# Patient Record
Sex: Male | Born: 1980 | Race: White | Hispanic: No | Marital: Married | Smoking: Never smoker
Health system: Southern US, Community
[De-identification: ages and names within clinical notes are randomized; demographics above are authoritative.]

## PROBLEM LIST (undated history)

## (undated) DIAGNOSIS — T7840XA Allergy, unspecified, initial encounter: Secondary | ICD-10-CM

## (undated) DIAGNOSIS — M1990 Unspecified osteoarthritis, unspecified site: Secondary | ICD-10-CM

## (undated) HISTORY — DX: Unspecified osteoarthritis, unspecified site: M19.90

## (undated) HISTORY — DX: Allergy, unspecified, initial encounter: T78.40XA

---

## 2003-03-08 ENCOUNTER — Emergency Department (HOSPITAL_COMMUNITY): Admission: EM | Admit: 2003-03-08 | Discharge: 2003-03-08 | Payer: Self-pay | Admitting: Family Medicine

## 2003-03-19 ENCOUNTER — Emergency Department (HOSPITAL_COMMUNITY): Admission: EM | Admit: 2003-03-19 | Discharge: 2003-03-19 | Payer: Self-pay | Admitting: Family Medicine

## 2006-03-06 ENCOUNTER — Emergency Department (HOSPITAL_COMMUNITY): Admission: EM | Admit: 2006-03-06 | Discharge: 2006-03-06 | Payer: Self-pay | Admitting: Family Medicine

## 2014-03-04 ENCOUNTER — Encounter: Payer: Self-pay | Admitting: Family Medicine

## 2014-03-24 ENCOUNTER — Ambulatory Visit (INDEPENDENT_AMBULATORY_CARE_PROVIDER_SITE_OTHER): Payer: BLUE CROSS/BLUE SHIELD | Admitting: Family Medicine

## 2014-03-24 ENCOUNTER — Encounter: Payer: Self-pay | Admitting: Family Medicine

## 2014-03-24 VITALS — BP 118/70 | HR 94 | Temp 98.3°F | Resp 18 | Ht 71.0 in | Wt 193.0 lb

## 2014-03-24 DIAGNOSIS — E291 Testicular hypofunction: Secondary | ICD-10-CM

## 2014-03-24 DIAGNOSIS — Z0000 Encounter for general adult medical examination without abnormal findings: Secondary | ICD-10-CM

## 2014-03-24 DIAGNOSIS — Z92241 Personal history of systemic steroid therapy: Secondary | ICD-10-CM

## 2014-03-24 LAB — CBC WITH DIFFERENTIAL/PLATELET
Basophils Absolute: 0 K/uL (ref 0.0–0.1)
Basophils Relative: 0 % (ref 0–1)
Eosinophils Absolute: 0.2 K/uL (ref 0.0–0.7)
Eosinophils Relative: 3 % (ref 0–5)
HCT: 45.2 % (ref 39.0–52.0)
Hemoglobin: 15.1 g/dL (ref 13.0–17.0)
Lymphocytes Relative: 27 % (ref 12–46)
Lymphs Abs: 1.8 K/uL (ref 0.7–4.0)
MCH: 30 pg (ref 26.0–34.0)
MCHC: 33.4 g/dL (ref 30.0–36.0)
MCV: 89.9 fL (ref 78.0–100.0)
MPV: 10.6 fL (ref 8.6–12.4)
Monocytes Absolute: 0.5 K/uL (ref 0.1–1.0)
Monocytes Relative: 7 % (ref 3–12)
Neutro Abs: 4.3 K/uL (ref 1.7–7.7)
Neutrophils Relative %: 63 % (ref 43–77)
Platelets: 184 K/uL (ref 150–400)
RBC: 5.03 MIL/uL (ref 4.22–5.81)
RDW: 13.6 % (ref 11.5–15.5)
WBC: 6.8 K/uL (ref 4.0–10.5)

## 2014-03-24 LAB — COMPLETE METABOLIC PANEL WITH GFR
ALT: 34 U/L (ref 0–53)
AST: 40 U/L — AB (ref 0–37)
Albumin: 4.9 g/dL (ref 3.5–5.2)
Alkaline Phosphatase: 73 U/L (ref 39–117)
BUN: 21 mg/dL (ref 6–23)
CHLORIDE: 100 meq/L (ref 96–112)
CO2: 26 mEq/L (ref 19–32)
Calcium: 9.7 mg/dL (ref 8.4–10.5)
Creat: 1.25 mg/dL (ref 0.50–1.35)
GFR, Est African American: 88 mL/min
GFR, Est Non African American: 76 mL/min
Glucose, Bld: 83 mg/dL (ref 70–99)
Potassium: 4 mEq/L (ref 3.5–5.3)
Sodium: 135 mEq/L (ref 135–145)
Total Bilirubin: 0.4 mg/dL (ref 0.2–1.2)
Total Protein: 7.4 g/dL (ref 6.0–8.3)

## 2014-03-24 LAB — TSH: TSH: 1.226 u[IU]/mL (ref 0.350–4.500)

## 2014-03-24 NOTE — Progress Notes (Signed)
Subjective:    Patient ID: Joshua BenchJonathan T Lotts, male    DOB: 10/13/1980, 34 y.o.   MRN: [id number]  HPI Patient is here today to establish care. Patient admits that in the past he is a used and abused anabolic steroids. He reports now that he is being cleared from steroids for over 2 years. Unfortunately he has poor energy, erectile dysfunction, decreased libido, and fatigue. He denies any headaches. He does have a tender nodule in his right breast deep to the nipple. It feels like an enlarged duct. It is extremely tender to the touch. This is been there for approximately 1 year. He denies any nipple discharge, milky nipple discharge, or bloody nipple discharge. Otherwise his review of systems is negative Past Medical History  Diagnosis Date  . Allergy   . Arthritis    No past surgical history on file. Current Outpatient Prescriptions on File Prior to Visit  Medication Sig Dispense Refill  . Melatonin 5 MG CAPS Take 2 capsules by mouth at bedtime.     No current facility-administered medications on file prior to visit.   Allergies  Allergen Reactions  . Amoxicillin Hives   History   Social History  . Marital Status: Married    Spouse Name: N/A  . Number of Children: N/A  . Years of Education: N/A   Occupational History  . Not on file.   Social History Main Topics  . Smoking status: Never Smoker   . Smokeless tobacco: Current User    Types: Chew  . Alcohol Use: No  . Drug Use: No  . Sexual Activity: Yes   Other Topics Concern  . Not on file   Social History Narrative   Family History  Problem Relation Age of Onset  . Diabetes Father   . Heart disease Father   . Arthritis Maternal Grandmother   . Cancer Maternal Grandmother       Review of Systems  All other systems reviewed and are negative.      Objective:   Physical Exam  Constitutional: He is oriented to person, place, and time. He appears well-developed and well-nourished. No distress.  HENT:    Head: Normocephalic and atraumatic.  Right Ear: External ear normal.  Left Ear: External ear normal.  Nose: Nose normal.  Mouth/Throat: Oropharynx is clear and moist. No oropharyngeal exudate.  Eyes: Conjunctivae and EOM are normal. Pupils are equal, round, and reactive to light. Right eye exhibits no discharge. Left eye exhibits no discharge. No scleral icterus.  Neck: Normal range of motion. Neck supple. No JVD present. No tracheal deviation present. No thyromegaly present.  Cardiovascular: Normal rate, regular rhythm, normal heart sounds and intact distal pulses.  Exam reveals no gallop and no friction rub.   No murmur heard. Pulmonary/Chest: Effort normal and breath sounds normal. No stridor. No respiratory distress. He has no wheezes. He has no rales. He exhibits no tenderness.  Abdominal: Soft. Bowel sounds are normal. He exhibits no distension and no mass. There is no tenderness. There is no rebound and no guarding.  Genitourinary: Penis normal.  Musculoskeletal: Normal range of motion. He exhibits no edema or tenderness.  Lymphadenopathy:    He has no cervical adenopathy.  Neurological: He is alert and oriented to person, place, and time. He has normal reflexes. He displays normal reflexes. No cranial nerve deficit. He exhibits normal muscle tone. Coordination normal.  Skin: He is not diaphoretic.  Vitals reviewed. Both testicles are descended and are normal without nodularity  or atrophy. There is a 1.5 cm tender nodular mass in his right nipple that appears to be an enlarged duct        Assessment & Plan:  Hypogonadism, testicular - Plan: COMPLETE METABOLIC PANEL WITH GFR, CBC with Differential/Platelet, TSH, Prolactin, Follicle stimulating hormone, Luteinizing hormone, Testosterone, free, total  Routine general medical examination at a health care facility - Plan: COMPLETE METABOLIC PANEL WITH GFR, CBC with Differential/Platelet, TSH, Prolactin, Follicle stimulating hormone,  Luteinizing hormone, Testosterone, free, total  History anabolic steroid use - Plan: COMPLETE METABOLIC PANEL WITH GFR, CBC with Differential/Platelet, TSH, Prolactin, Follicle stimulating hormone, Luteinizing hormone, Testosterone, free, total  Physical exam is relatively normal. I will check a CBC, CMP for risk stratification. I will also check a free testosterone level, prolactin level, and FSH, and LH level to evaluate for hypogonadism pituitary insufficiency secondary to his history of anabolic steroid use. If lab work is normal, I will proceed with an ultrasound of the breast to evaluate the mass in his right nipple further however I believe it is a benign enlarged duct.

## 2014-03-25 LAB — TESTOSTERONE, FREE, TOTAL, SHBG
SEX HORMONE BINDING: 100 nmol/L — AB (ref 10–50)
TESTOSTERONE FREE: 54.3 pg/mL (ref 47.0–244.0)
Testosterone-% Free: 0.9 % — ABNORMAL LOW (ref 1.6–2.9)
Testosterone: 584.43 ng/dL (ref 300–890)

## 2014-03-25 LAB — PROLACTIN: Prolactin: 10.4 ng/mL (ref 2.1–17.1)

## 2014-03-25 LAB — LUTEINIZING HORMONE: LH: 7.8 m[IU]/mL (ref 1.5–9.3)

## 2014-03-25 LAB — FOLLICLE STIMULATING HORMONE: FSH: 7.9 m[IU]/mL (ref 1.4–18.1)

## 2014-03-26 ENCOUNTER — Other Ambulatory Visit: Payer: Self-pay | Admitting: *Deleted

## 2014-03-26 ENCOUNTER — Telehealth: Payer: Self-pay | Admitting: Family Medicine

## 2014-03-26 DIAGNOSIS — N6310 Unspecified lump in the right breast, unspecified quadrant: Secondary | ICD-10-CM

## 2014-03-26 MED ORDER — SILDENAFIL CITRATE 100 MG PO TABS: 50.0000 mg | ORAL_TABLET | Freq: Every day | ORAL | Status: DC | PRN

## 2014-03-26 NOTE — Telephone Encounter (Signed)
Patient call and would like a call back regarding going over his lab results with him  [phone]

## 2014-03-26 NOTE — Telephone Encounter (Signed)
Call placed to patient and patient made aware.   Patient states that he spoke with insurance and was advised that they will not cover ED medication.   Requested to have Viagra called in to pharmacy.   Prescription sent to pharmacy.

## 2014-03-26 NOTE — Telephone Encounter (Signed)
Testosterone is not low.  It is not contributing to ED.  I would try viagra 50-100 mg poqday prn (6) rf =11

## 2014-03-26 NOTE — Telephone Encounter (Signed)
Call returned to patient.   States that he is concerned that there was no estrogen level drawn. He is also concerned that mass may grow over time if it is mammary related. Advised that surgical consult will be placed.   Also states that he is concerned that some of the testosterone levels were low, and feels that this may be contributing to ED. States that he discussed prescription for ED with MD.   MD please advise.

## 2014-03-26 NOTE — Telephone Encounter (Signed)
Returned call to patient.   States that he would like to try Cialis instead.   Also concerned about why sex binding hormone is elevated.   MD please advise.

## 2014-03-26 NOTE — Telephone Encounter (Signed)
Sex binding globulin is a naturally occuring protein that decreases the amount of free testosterone floating in the blood that is biologically active.  While the %free is low, the actual free testosterone level is normal and not playing a role in his problems.  I would suggest cialis 10-20 mg poqday prn

## 2014-04-01 ENCOUNTER — Telehealth: Payer: Self-pay | Admitting: Family Medicine

## 2014-04-01 NOTE — Telephone Encounter (Signed)
Complaining of a lot of situational stress.  Is wanting you to order him something he can take everyday to help with this.  Does not want Xanax or similar that will make him sleepy.  Was on Zoloft many years ago??  Wants your recommendation.  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  Just seen for CPE but if need to see again will schedule appt.

## 2014-04-02 MED ORDER — VENLAFAXINE HCL ER 75 MG PO CP24: 75.0000 mg | ORAL_CAPSULE | Freq: Every day | ORAL | Status: DC

## 2014-04-02 NOTE — Telephone Encounter (Signed)
Pt called and made aware of RX.  3 wk appt made

## 2014-04-02 NOTE — Telephone Encounter (Signed)
Lets try effexor xr 75 mg poqam and recheck here in 3 weeks to see if we need to increase to 150 mg.

## 2014-04-21 ENCOUNTER — Other Ambulatory Visit (HOSPITAL_COMMUNITY): Payer: Self-pay | Admitting: Orthopedic Surgery

## 2014-04-21 DIAGNOSIS — N6310 Unspecified lump in the right breast, unspecified quadrant: Secondary | ICD-10-CM

## 2014-04-24 ENCOUNTER — Encounter: Payer: Self-pay | Admitting: Family Medicine

## 2014-04-24 ENCOUNTER — Ambulatory Visit (INDEPENDENT_AMBULATORY_CARE_PROVIDER_SITE_OTHER): Payer: BLUE CROSS/BLUE SHIELD | Admitting: Family Medicine

## 2014-04-24 VITALS — BP 102/78 | HR 80 | Temp 97.8°F | Resp 18 | Wt 190.0 lb

## 2014-04-24 DIAGNOSIS — F411 Generalized anxiety disorder: Secondary | ICD-10-CM

## 2014-04-24 MED ORDER — BUPROPION HCL ER (XL) 150 MG PO TB24: 150.0000 mg | ORAL_TABLET | Freq: Every day | ORAL | Status: DC

## 2014-04-24 NOTE — Progress Notes (Signed)
   Subjective:    Patient ID: Joshua Dawson, male    DOB: 07/25/1980, 34 y.o.   MRN: [id number]  HPI Please see last phone note.  Was started on Effexor 75 mg XR poqam.  Patient states it is causing him to feel nervous and makes him feel too shaky.  He reports daily generalized anxiety. He denies panic attacks. He does report excessive worry over minor problems. Even after reassuring the patient that his labs are normal the patient still is worried he has a problem. He admits that this is become pathologic it prevents him from enjoying life. He reports insomnia. He has trouble concentrating due to fear and anxiety. He would like to try different option other than Effexor Past Medical History  Diagnosis Date  . Allergy   . Arthritis    No past surgical history on file. Current Outpatient Prescriptions on File Prior to Visit  Medication Sig Dispense Refill  . Melatonin 5 MG CAPS Take 2 capsules by mouth at bedtime.    . sildenafil (VIAGRA) 100 MG tablet Take 0.5-1 tablets (50-100 mg total) by mouth daily as needed for erectile dysfunction. 6 tablet 11  . venlafaxine XR (EFFEXOR-XR) 75 MG 24 hr capsule Take 1 capsule (75 mg total) by mouth daily with breakfast. 30 capsule 0   No current facility-administered medications on file prior to visit.   Allergies  Allergen Reactions  . Amoxicillin Hives   History   Social History  . Marital Status: Married    Spouse Name: N/A  . Number of Children: N/A  . Years of Education: N/A   Occupational History  . Not on file.   Social History Main Topics  . Smoking status: Never Smoker   . Smokeless tobacco: Current User    Types: Chew  . Alcohol Use: No  . Drug Use: No  . Sexual Activity: Yes   Other Topics Concern  . Not on file   Social History Narrative      Review of Systems  All other systems reviewed and are negative.      Objective:   Physical Exam  Cardiovascular: Normal rate, regular rhythm and normal heart sounds.    Pulmonary/Chest: Effort normal and breath sounds normal.  Psychiatric: He has a normal mood and affect. His behavior is normal. Judgment and thought content normal.  Vitals reviewed.         Assessment & Plan:  GAD (generalized anxiety disorder) - Plan: buPROPion (WELLBUTRIN XL) 150 MG 24 hr tablet  Begin Wellbutrin XL 150 mg by mouth every morning. Recheck in one month.  DC Effexor

## 2014-04-28 ENCOUNTER — Ambulatory Visit (HOSPITAL_COMMUNITY)
Admission: RE | Admit: 2014-04-28 | Discharge: 2014-04-28 | Disposition: A | Discharge status: Home / Self Care | Payer: BLUE CROSS/BLUE SHIELD | Source: Ambulatory Visit | Attending: Orthopedic Surgery | Admitting: Orthopedic Surgery

## 2014-04-28 ENCOUNTER — Other Ambulatory Visit (HOSPITAL_COMMUNITY): Payer: Self-pay | Admitting: Orthopedic Surgery

## 2014-04-28 DIAGNOSIS — N63 Unspecified lump in breast: Secondary | ICD-10-CM | POA: Insufficient documentation

## 2014-04-28 DIAGNOSIS — N6310 Unspecified lump in the right breast, unspecified quadrant: Secondary | ICD-10-CM

## 2014-05-06 ENCOUNTER — Other Ambulatory Visit: Payer: Self-pay | Admitting: Internal Medicine

## 2014-12-04 ENCOUNTER — Ambulatory Visit (INDEPENDENT_AMBULATORY_CARE_PROVIDER_SITE_OTHER): Payer: BLUE CROSS/BLUE SHIELD | Admitting: Family Medicine

## 2014-12-04 ENCOUNTER — Encounter: Payer: Self-pay | Admitting: Family Medicine

## 2014-12-04 ENCOUNTER — Telehealth: Payer: Self-pay | Admitting: Family Medicine

## 2014-12-04 VITALS — BP 104/70 | HR 84 | Temp 98.6°F | Resp 18 | Wt 188.0 lb

## 2014-12-04 DIAGNOSIS — E291 Testicular hypofunction: Secondary | ICD-10-CM

## 2014-12-04 NOTE — Telephone Encounter (Signed)
Patient would like to start using [facility] his ID is [id number] the Rx Grp is [id number]. I have scanned a copy of the prescription card as well.

## 2014-12-04 NOTE — Progress Notes (Signed)
Subjective:    Patient ID: Joshua Dawson, male    DOB: 24-Mar-1980, 34 y.o.   MRN: [id number]  HPI  Please see previous office visits. When I enter the exam room today, the patient changed the chief complaint from numbness in his left fifth toe to his persistent erectile dysfunction. When he takes Viagra, he has no problems with erectile dysfunction. However he is still concerned that there is a hormone imbalance in his body. I have tried to assuage his fears several times in the past but he continues to fixate on this concern. He is requesting that he have an estrogen level checked today. He is also concerned because his sex hormone binding globulin level has been high in the past even though his percent free testosterone and free testosterone levels were normal. Past Medical History  Diagnosis Date  . Allergy   . Arthritis    No past surgical history on file. Current Outpatient Prescriptions on File Prior to Visit  Medication Sig Dispense Refill  . Melatonin 5 MG CAPS Take 2 capsules by mouth at bedtime.    Marland Kitchen buPROPion (WELLBUTRIN XL) 150 MG 24 hr tablet Take 1 tablet (150 mg total) by mouth daily. (Patient not taking: Reported on 12/04/2014) 30 tablet 5  . sildenafil (VIAGRA) 100 MG tablet Take 0.5-1 tablets (50-100 mg total) by mouth daily as needed for erectile dysfunction. 6 tablet 11  . venlafaxine XR (EFFEXOR-XR) 75 MG 24 hr capsule Take 1 capsule (75 mg total) by mouth daily with breakfast. (Patient not taking: Reported on 12/04/2014) 30 capsule 0   No current facility-administered medications on file prior to visit.   Allergies  Allergen Reactions  . Amoxicillin Hives   Social History   Social History  . Marital Status: Married    Spouse Name: N/A  . Number of Children: N/A  . Years of Education: N/A   Occupational History  . Not on file.   Social History Main Topics  . Smoking status: Never Smoker   . Smokeless tobacco: Current User    Types: Chew  . Alcohol  Use: No  . Drug Use: No  . Sexual Activity: Yes   Other Topics Concern  . Not on file   Social History Narrative   Family History  Problem Relation Age of Onset  . Diabetes Father   . Heart disease Father   . Arthritis Maternal Grandmother   . Cancer Maternal Grandmother       Review of Systems  All other systems reviewed and are negative.      Objective:   Physical Exam  Constitutional: He is oriented to person, place, and time. He appears well-developed and well-nourished. No distress.  Eyes: Conjunctivae and EOM are normal. Pupils are equal, round, and reactive to light. Right eye exhibits no discharge. Left eye exhibits no discharge. No scleral icterus.  Cardiovascular: Normal rate, regular rhythm, normal heart sounds and intact distal pulses.  Exam reveals no gallop and no friction rub.   No murmur heard. Pulmonary/Chest: Effort normal and breath sounds normal. No respiratory distress. He has no wheezes. He has no rales. He exhibits no tenderness.  Abdominal: Soft. Bowel sounds are normal. He exhibits no distension and no mass. There is no tenderness. There is no rebound and no guarding.  Neurological: He is alert and oriented to person, place, and time. He has normal reflexes. No cranial nerve deficit. He exhibits normal muscle tone. Coordination normal.  Skin: He is not diaphoretic.  Vitals reviewed.       Assessment & Plan:  Hypogonadism in male - Plan: Testosterone, Free, Total, SHBG, Estrogens, Total  I tried to reassure the patient that he wants his hormone levels checked again for peace of mind. I will gladly recheck his hormone levels. However I explained to the patient I believe his anxiety is causing him to fixate on this. If his hormone levels are normal, I will continue to treat the patient's erectile dysfunction with Viagra 50-100 mg by mouth daily when necessary.

## 2014-12-07 LAB — TESTOSTERONE, FREE, TOTAL, SHBG
SEX HORMONE BINDING: 103 nmol/L — AB (ref 10–50)
TESTOSTERONE-% FREE: 1 % — AB (ref 1.6–2.9)
TESTOSTERONE: 817 ng/dL (ref 300–890)
Testosterone, Free: 78.1 pg/mL (ref 47.0–244.0)

## 2014-12-08 LAB — ESTROGENS, TOTAL: ESTROGEN: 188.7 pg/mL (ref 60–190)

## 2014-12-10 ENCOUNTER — Telehealth: Payer: Self-pay | Admitting: *Deleted

## 2014-12-10 NOTE — Telephone Encounter (Signed)
Trintellix to replace effexor.  Begin 5 mg poqday and increase to 10 mg poqday in 1 week.  I would have him come by for samples and also a copay card.

## 2014-12-10 NOTE — Telephone Encounter (Signed)
Call placed to patient to discuss lab results.   States that during OV, MD recommended a new anti-anxiety medication.   MD please advise.

## 2014-12-10 NOTE — Telephone Encounter (Signed)
Call placed to patient and patient made aware.   Samples for Trintellix 5mg  left up front.   No samples available for 10mg .

## 2014-12-17 NOTE — Telephone Encounter (Signed)
 10mg  samples left up front for patient.

## 2015-08-05 ENCOUNTER — Ambulatory Visit (INDEPENDENT_AMBULATORY_CARE_PROVIDER_SITE_OTHER): Payer: BLUE CROSS/BLUE SHIELD | Admitting: Family Medicine

## 2015-08-05 ENCOUNTER — Encounter: Payer: Self-pay | Admitting: Family Medicine

## 2015-08-05 VITALS — BP 132/72 | HR 88 | Temp 98.4°F | Resp 16 | Ht 71.0 in | Wt 187.0 lb

## 2015-08-05 DIAGNOSIS — N5089 Other specified disorders of the male genital organs: Secondary | ICD-10-CM

## 2015-08-05 DIAGNOSIS — N509 Disorder of male genital organs, unspecified: Secondary | ICD-10-CM

## 2015-08-05 NOTE — Progress Notes (Signed)
   Subjective:    Patient ID: Joshua Dawson, male    DOB: 02/01/1980, 35 y.o.   MRN: [id number]  HPI Patient has been sawing wood recently on his property. He developed pain in his lower back. He also developed pain in the left inguinal canal. In palpating that area he found a small BB sized mass on his left testicle near the epididymis. He also reports pain in the left inguinal canal. On examination today, I can appreciate no testicular mass. I believe the patient was feeling the appendix of the testes near the epididymis. There is no evidence of testicular cancer, varicocele, cystocele, or lymphadenopathy. On examination of the left inguinal canal however there may be a small inguinal hernia that I can appreciate on the tip of my finger with strenuous Valsalva. Past Medical History  Diagnosis Date  . Allergy   . Arthritis    No past surgical history on file. No current outpatient prescriptions on file prior to visit.   No current facility-administered medications on file prior to visit.   Allergies  Allergen Reactions  . Amoxicillin Hives   Social History   Social History  . Marital Status: Married    Spouse Name: N/A  . Number of Children: N/A  . Years of Education: N/A   Occupational History  . Not on file.   Social History Main Topics  . Smoking status: Never Smoker   . Smokeless tobacco: Current User    Types: Chew  . Alcohol Use: No  . Drug Use: No  . Sexual Activity: Yes   Other Topics Concern  . Not on file   Social History Narrative      Review of Systems  All other systems reviewed and are negative.      Objective:   Physical Exam  Cardiovascular: Normal rate, regular rhythm and normal heart sounds.   Pulmonary/Chest: Effort normal and breath sounds normal. No respiratory distress. He has no wheezes. He has no rales.  Abdominal: Soft. Bowel sounds are normal. A hernia is present. Hernia confirmed positive in the left inguinal area.    Genitourinary: Testes normal and penis normal. Right testis shows no mass, no swelling and no tenderness. Right testis is descended. Cremasteric reflex is not absent on the right side. Left testis shows no mass, no swelling and no tenderness. Left testis is descended. Cremasteric reflex is not absent on the left side.  Musculoskeletal: He exhibits no tenderness.  Lymphadenopathy:       Left: No inguinal adenopathy present.  Vitals reviewed.         Assessment & Plan:  Testicular mass  I believe the small BB sized mass on his left testicle is actually a benign growth call the appendix of the testicle. I can appreciate no testicular mass today on exam. There is no lymphadenopathy. There is no varicocele or cystocele. He may have a very small left inguinal hernia. At the present time we will monitor this area clinically and should he develop swelling in the left inguinal canal, I will consult general surgery. I believe the pain in his lower back was likely a muscle strain due to lifting heavy weight while cutting firewood

## 2016-06-12 ENCOUNTER — Ambulatory Visit (INDEPENDENT_AMBULATORY_CARE_PROVIDER_SITE_OTHER): Payer: BLUE CROSS/BLUE SHIELD | Admitting: Family Medicine

## 2016-06-12 ENCOUNTER — Encounter: Payer: Self-pay | Admitting: Family Medicine

## 2016-06-12 VITALS — BP 110/80 | HR 82 | Temp 97.8°F | Resp 16 | Ht 71.0 in | Wt 187.0 lb

## 2016-06-12 DIAGNOSIS — G43109 Migraine with aura, not intractable, without status migrainosus: Secondary | ICD-10-CM

## 2016-06-12 NOTE — Progress Notes (Signed)
     Subjective:    Patient ID: Joshua BenchJonathan T Njie, male    DOB: 03/14/1980, 36 y.o.   MRN: [id number]  HPI Patient Reports that 5 times in the last year, he has had a vertical zigzag line form in his vision followed by a pulsatile headache that last approximately one hour. There is no photophobia but there is photophobia. There is also nausea. Headache abruptly subsides if he takes ibuprofen. He denies any head injury. He denies any seizure activity. He denies any double vision. He denies any neurologic deficit. There is no family history of migraines. His vision is perfect otherwise. He gets his eyes checked regularly at work and he has no visual deficits. Past Medical History:  Diagnosis Date  . Allergy   . Arthritis    No past surgical history on file. No current outpatient prescriptions on file prior to visit.   No current facility-administered medications on file prior to visit.    Allergies  Allergen Reactions  . Amoxicillin Hives   Social History   Social History  . Marital status: Married    Spouse name: N/A  . Number of children: N/A  . Years of education: N/A   Occupational History  . Not on file.   Social History Main Topics  . Smoking status: Never Smoker  . Smokeless tobacco: Current User    Types: Chew  . Alcohol use No  . Drug use: No  . Sexual activity: Yes   Other Topics Concern  . Not on file   Social History Narrative  . No narrative on file      Review of Systems  All other systems reviewed and are negative.      Objective:   Physical Exam  Constitutional: He is oriented to person, place, and time.  Eyes:  Fundoscopic exam:      The right eye shows no hemorrhage and no papilledema.       The left eye shows no hemorrhage and no papilledema.  Cardiovascular: Normal rate, regular rhythm and normal heart sounds.   Pulmonary/Chest: Effort normal and breath sounds normal. No respiratory distress. He has no wheezes. He has no rales.    Abdominal: Soft. Bowel sounds are normal.  Musculoskeletal: He exhibits no tenderness.  Neurological: He is alert and oriented to person, place, and time. He has normal strength and normal reflexes. No cranial nerve deficit or sensory deficit. He displays a negative Romberg sign.  Vitals reviewed.         Assessment & Plan:  Migraine with aura and without status migrainosus, not intractable  History is consistent for migraine with aura. At the present time they have only occurred 5 times the last year. They also easily resolved with ibuprofen. Therefore I recommended ibuprofen as needed for the headaches and reassurance. If symptoms change, recheck immediately

## 2016-08-22 ENCOUNTER — Ambulatory Visit (INDEPENDENT_AMBULATORY_CARE_PROVIDER_SITE_OTHER): Payer: BLUE CROSS/BLUE SHIELD | Admitting: Urology

## 2016-08-22 DIAGNOSIS — N522 Drug-induced erectile dysfunction: Secondary | ICD-10-CM

## 2016-08-22 DIAGNOSIS — R102 Pelvic and perineal pain: Secondary | ICD-10-CM | POA: Diagnosis not present

## 2016-09-19 ENCOUNTER — Ambulatory Visit: Payer: BLUE CROSS/BLUE SHIELD | Admitting: General Surgery

## 2016-10-05 ENCOUNTER — Ambulatory Visit (INDEPENDENT_AMBULATORY_CARE_PROVIDER_SITE_OTHER): Payer: BLUE CROSS/BLUE SHIELD | Admitting: General Surgery

## 2016-10-05 ENCOUNTER — Encounter: Payer: Self-pay | Admitting: General Surgery

## 2016-10-05 VITALS — BP 128/79 | HR 98 | Temp 98.6°F | Resp 20 | Ht 71.0 in | Wt 191.0 lb

## 2016-10-05 DIAGNOSIS — K4090 Unilateral inguinal hernia, without obstruction or gangrene, not specified as recurrent: Secondary | ICD-10-CM

## 2016-10-05 NOTE — Patient Instructions (Signed)

## 2016-10-05 NOTE — Progress Notes (Signed)
Joshua BenchJonathan T Pallas; [id number]; 11/23/1980   HPI   Patient is a 36 year old white male who was referred to my care by Dr Ronne BinningMckenzie for a left inguinal hernia.  He was referred to urology due to left testicular pain.  He states that this started recently while lifting weights.  He works out frequently.  He denies any nausea or vomiting.  He states the pain has decreased since he is likely due to his workout load.  He currently has no pain.   He does not notice a significant bulge.  The pain does radiate down to the left testicle. Past Medical History:  Diagnosis Date  . Allergy   . Arthritis     No past surgical history on file.  Family History  Problem Relation Age of Onset  . Diabetes Father   . Heart disease Father   . Arthritis Maternal Grandmother   . Cancer Maternal Grandmother     No current outpatient prescriptions on file prior to visit.   No current facility-administered medications on file prior to visit.     Allergies  Allergen Reactions  . Amoxicillin Hives    History  Alcohol Use No    History  Smoking Status  . Never Smoker  Smokeless Tobacco  . Current User  . Types: Chew    Review of Systems  Constitutional: Negative.   HENT: Negative.   Eyes: Negative.   Respiratory: Negative.   Cardiovascular: Negative.   Gastrointestinal: Negative.   Genitourinary: Negative.   Musculoskeletal: Negative.   Skin: Negative.   Neurological: Negative.   Endo/Heme/Allergies: Negative.   Psychiatric/Behavioral: Negative.     Objective   Vitals:   10/05/16 1205  BP: 128/79  Pulse: 98  Resp: 20  Temp: 98.6 F (37 C)    Physical Exam  Constitutional: He is oriented to person, place, and time and well-developed, well-nourished, and in no distress.  HENT:  Head: Normocephalic and atraumatic.  Cardiovascular: Normal rate, regular rhythm and normal heart sounds.  Exam reveals no friction rub.   No murmur heard. Pulmonary/Chest: Effort normal and breath  sounds normal. No respiratory distress. He has no wheezes. He has no rales.  Abdominal: Soft. Bowel sounds are normal. He exhibits no distension. There is no tenderness. There is no rebound.  Small easily reducible left inguinal hernia with weak floor.  Neurological: He is alert and oriented to person, place, and time.  Skin: Skin is warm and dry.  Vitals reviewed.  Dr. Dimas MillinMcKenzie's notes reviewed. Assessment    Left inguinal hernia, currently asymptomatic Plan    I did offer surgical repair, but the patient states he is trying to control his workouts so that it does not cause him a problem.  Should he continue having issues with future, I do recommend a left renal herniorrhaphy with mesh.  He understands this and agrees.  Follow up as needed.

## 2016-11-24 DIAGNOSIS — E291 Testicular hypofunction: Secondary | ICD-10-CM | POA: Diagnosis not present

## 2016-11-29 ENCOUNTER — Ambulatory Visit (INDEPENDENT_AMBULATORY_CARE_PROVIDER_SITE_OTHER): Payer: BLUE CROSS/BLUE SHIELD | Admitting: Urology

## 2016-11-29 DIAGNOSIS — E291 Testicular hypofunction: Secondary | ICD-10-CM | POA: Diagnosis not present

## 2016-11-29 DIAGNOSIS — N522 Drug-induced erectile dysfunction: Secondary | ICD-10-CM | POA: Diagnosis not present

## 2017-02-21 ENCOUNTER — Ambulatory Visit (INDEPENDENT_AMBULATORY_CARE_PROVIDER_SITE_OTHER): Payer: BLUE CROSS/BLUE SHIELD | Admitting: Urology

## 2017-02-21 DIAGNOSIS — R109 Unspecified abdominal pain: Secondary | ICD-10-CM

## 2017-05-23 ENCOUNTER — Ambulatory Visit (INDEPENDENT_AMBULATORY_CARE_PROVIDER_SITE_OTHER): Payer: BLUE CROSS/BLUE SHIELD | Admitting: Urology

## 2017-05-23 DIAGNOSIS — N522 Drug-induced erectile dysfunction: Secondary | ICD-10-CM

## 2017-05-23 DIAGNOSIS — E291 Testicular hypofunction: Secondary | ICD-10-CM | POA: Diagnosis not present

## 2017-05-23 DIAGNOSIS — R109 Unspecified abdominal pain: Secondary | ICD-10-CM | POA: Diagnosis not present

## 2017-08-23 ENCOUNTER — Ambulatory Visit: Payer: BLUE CROSS/BLUE SHIELD | Admitting: "Endocrinology

## 2017-10-11 ENCOUNTER — Ambulatory Visit: Payer: BLUE CROSS/BLUE SHIELD | Admitting: "Endocrinology

## 2017-11-21 ENCOUNTER — Ambulatory Visit: Payer: BLUE CROSS/BLUE SHIELD | Admitting: Urology

## 2017-11-26 ENCOUNTER — Ambulatory Visit: Payer: BLUE CROSS/BLUE SHIELD | Admitting: "Endocrinology

## 2017-12-26 ENCOUNTER — Ambulatory Visit (INDEPENDENT_AMBULATORY_CARE_PROVIDER_SITE_OTHER): Payer: BLUE CROSS/BLUE SHIELD | Admitting: Urology

## 2017-12-26 DIAGNOSIS — N522 Drug-induced erectile dysfunction: Secondary | ICD-10-CM

## 2017-12-26 DIAGNOSIS — R109 Unspecified abdominal pain: Secondary | ICD-10-CM

## 2017-12-26 DIAGNOSIS — E291 Testicular hypofunction: Secondary | ICD-10-CM | POA: Diagnosis not present

## 2018-04-01 DIAGNOSIS — E291 Testicular hypofunction: Secondary | ICD-10-CM | POA: Diagnosis not present

## 2018-04-03 ENCOUNTER — Ambulatory Visit: Payer: BLUE CROSS/BLUE SHIELD | Admitting: Urology

## 2018-10-02 ENCOUNTER — Ambulatory Visit: Payer: BLUE CROSS/BLUE SHIELD | Admitting: Urology

## 2018-11-27 ENCOUNTER — Ambulatory Visit (INDEPENDENT_AMBULATORY_CARE_PROVIDER_SITE_OTHER): Payer: BC Managed Care – PPO | Admitting: Urology

## 2018-11-27 DIAGNOSIS — E291 Testicular hypofunction: Secondary | ICD-10-CM

## 2018-11-27 DIAGNOSIS — N522 Drug-induced erectile dysfunction: Secondary | ICD-10-CM

## 2019-01-21 ENCOUNTER — Encounter: Payer: Self-pay | Admitting: Urology

## 2019-07-11 ENCOUNTER — Other Ambulatory Visit: Payer: Self-pay

## 2019-07-11 DIAGNOSIS — E291 Testicular hypofunction: Secondary | ICD-10-CM | POA: Diagnosis not present

## 2019-07-14 LAB — ESTRADIOL: Estradiol: 20 pg/mL (ref <=39)

## 2019-07-14 LAB — TESTOSTERONE TOTAL,FREE,BIO, MALES
Albumin: 4.9 g/dL (ref 3.6–5.1)
Sex Hormone Binding: 104 nmol/L — ABNORMAL HIGH (ref 10–50)
Testosterone, Bioavailable: 88.9 ng/dL — ABNORMAL LOW (ref >=110.0)
Testosterone, Free: 39.8 pg/mL — ABNORMAL LOW (ref 46.0–224.0)
Testosterone: 817 ng/dL (ref 250–827)

## 2019-07-14 LAB — CBC WITH DIFFERENTIAL/PLATELET
Absolute Monocytes: 504 cells/uL (ref 200–950)
Basophils Absolute: 44 cells/uL (ref 0–200)
Basophils Relative: 0.6 %
Eosinophils Absolute: 248 cells/uL (ref 15–500)
Eosinophils Relative: 3.4 %
HCT: 44 % (ref 38.5–50.0)
Hemoglobin: 14.9 g/dL (ref 13.2–17.1)
Lymphs Abs: 1467 cells/uL (ref 850–3900)
MCH: 30.2 pg (ref 27.0–33.0)
MCHC: 33.9 g/dL (ref 32.0–36.0)
MCV: 89.1 fL (ref 80.0–100.0)
MPV: 11.2 fL (ref 7.5–12.5)
Monocytes Relative: 6.9 %
Neutro Abs: 5037 cells/uL (ref 1500–7800)
Neutrophils Relative %: 69 %
Platelets: 185 10*3/uL (ref 140–400)
RBC: 4.94 10*6/uL (ref 4.20–5.80)
RDW: 12.6 % (ref 11.0–15.0)
Total Lymphocyte: 20.1 %
WBC: 7.3 10*3/uL (ref 3.8–10.8)

## 2019-07-14 LAB — COMPREHENSIVE METABOLIC PANEL
AG Ratio: 2.1 (calc) (ref 1.0–2.5)
ALT: 18 U/L (ref 9–46)
AST: 23 U/L (ref 10–40)
Albumin: 4.9 g/dL (ref 3.6–5.1)
Alkaline phosphatase (APISO): 67 U/L (ref 36–130)
BUN: 17 mg/dL (ref 7–25)
CO2: 29 mmol/L (ref 20–32)
Calcium: 9.8 mg/dL (ref 8.6–10.3)
Chloride: 103 mmol/L (ref 98–110)
Creat: 1.03 mg/dL (ref 0.60–1.35)
Globulin: 2.3 g/dL (calc) (ref 1.9–3.7)
Glucose, Bld: 98 mg/dL (ref 65–139)
Potassium: 4.3 mmol/L (ref 3.5–5.3)
Sodium: 138 mmol/L (ref 135–146)
Total Bilirubin: 0.5 mg/dL (ref 0.2–1.2)
Total Protein: 7.2 g/dL (ref 6.1–8.1)

## 2019-07-21 NOTE — Progress Notes (Signed)
Labs sent via my chart

## 2019-08-25 ENCOUNTER — Other Ambulatory Visit: Payer: Self-pay

## 2019-08-25 ENCOUNTER — Ambulatory Visit (INDEPENDENT_AMBULATORY_CARE_PROVIDER_SITE_OTHER): Payer: BC Managed Care – PPO | Admitting: Urology

## 2019-08-25 ENCOUNTER — Encounter: Payer: Self-pay | Admitting: Urology

## 2019-08-25 VITALS — BP 101/63 | HR 73 | Temp 97.9°F | Ht 72.0 in | Wt 190.0 lb

## 2019-08-25 DIAGNOSIS — N528 Other male erectile dysfunction: Secondary | ICD-10-CM | POA: Diagnosis not present

## 2019-08-25 DIAGNOSIS — E291 Testicular hypofunction: Secondary | ICD-10-CM | POA: Insufficient documentation

## 2019-08-25 MED ORDER — SILDENAFIL CITRATE 20 MG PO TABS: 20.0000 mg | ORAL_TABLET | ORAL | 3 refills | Status: DC | PRN

## 2019-08-25 NOTE — Patient Instructions (Signed)
Erectile Dysfunction Erectile dysfunction (ED) is the inability to get or keep an erection in order to have sexual intercourse. Erectile dysfunction may include:  Inability to get an erection.  Lack of enough hardness of the erection to allow penetration.  Loss of the erection before sex is finished. What are the causes? This condition may be caused by:  Certain medicines, such as: ? Pain relievers. ? Antihistamines. ? Antidepressants. ? Blood pressure medicines. ? Water pills (diuretics). ? Ulcer medicines. ? Muscle relaxants. ? Drugs.  Excessive drinking.  Psychological causes, such as: ? Anxiety. ? Depression. ? Sadness. ? Exhaustion. ? Performance fear. ? Stress.  Physical causes, such as: ? Artery problems. This may include diabetes, smoking, liver disease, or atherosclerosis. ? High blood pressure. ? Hormonal problems, such as low testosterone. ? Obesity. ? Nerve problems. This may include back or pelvic injuries, diabetes mellitus, multiple sclerosis, or Parkinson disease. What are the signs or symptoms? Symptoms of this condition include:  Inability to get an erection.  Lack of enough hardness of the erection to allow penetration.  Loss of the erection before sex is finished.  Normal erections at some times, but with frequent unsatisfactory episodes.  Low sexual satisfaction in either partner due to erection problems.  A curved penis occurring with erection. The curve may cause pain or the penis may be too curved to allow for intercourse.  Never having nighttime erections. How is this diagnosed? This condition is often diagnosed by:  Performing a physical exam to find other diseases or specific problems with the penis.  Asking you detailed questions about the problem.  Performing blood tests to check for diabetes mellitus or to measure hormone levels.  Performing other tests to check for underlying health conditions.  Performing an ultrasound  exam to check for scarring.  Performing a test to check blood flow to the penis.  Doing a sleep study at home to measure nighttime erections. How is this treated? This condition may be treated by:  Medicine taken by mouth to help you achieve an erection (oral medicine).  Hormone replacement therapy to replace low testosterone levels.  Medicine that is injected into the penis. Your health care provider may instruct you how to give yourself these injections at home.  Vacuum pump. This is a pump with a ring on it. The pump and ring are placed on the penis and used to create pressure that helps the penis become erect.  Penile implant surgery. In this procedure, you may receive: ? An inflatable implant. This consists of cylinders, a pump, and a reservoir. The cylinders can be inflated with a fluid that helps to create an erection, and they can be deflated after intercourse. ? A semi-rigid implant. This consists of two silicone rubber rods. The rods provide some rigidity. They are also flexible, so the penis can both curve downward in its normal position and become straight for sexual intercourse.  Blood vessel surgery, to improve blood flow to the penis. During this procedure, a blood vessel from a different part of the body is placed into the penis to allow blood to flow around (bypass) damaged or blocked blood vessels.  Lifestyle changes, such as exercising more, losing weight, and quitting smoking. Follow these instructions at home: Medicines   Take over-the-counter and prescription medicines only as told by your health care provider. Do not increase the dosage without first discussing it with your health care provider.  If you are using self-injections, perform injections as directed by your   health care provider. Make sure to avoid any veins that are on the surface of the penis. After giving an injection, apply pressure to the injection site for 5 minutes. General  instructions  Exercise regularly, as directed by your health care provider. Work with your health care provider to lose weight, if needed.  Do not use any products that contain nicotine or tobacco, such as cigarettes and e-cigarettes. If you need help quitting, ask your health care provider.  Before using a vacuum pump, read the instructions that come with the pump and discuss any questions with your health care provider.  Keep all follow-up visits as told by your health care provider. This is important. Contact a health care provider if:  You feel nauseous.  You vomit. Get help right away if:  You are taking oral or injectable medicines and you have an erection that lasts longer than 4 hours. If your health care provider is unavailable, go to the nearest emergency room for evaluation. An erection that lasts much longer than 4 hours can result in permanent damage to your penis.  You have severe pain in your groin or abdomen.  You develop redness or severe swelling of your penis.  You have redness spreading up into your groin or lower abdomen.  You are unable to urinate.  You experience chest pain or a rapid heart beat (palpitations) after taking oral medicines. Summary  Erectile dysfunction (ED) is the inability to get or keep an erection during sexual intercourse. This problem can usually be treated successfully.  This condition is diagnosed based on a physical exam, your symptoms, and tests to determine the cause. Treatment varies depending on the cause, and may include medicines, hormone therapy, surgery, or vacuum pump.  You may need follow-up visits to make sure that you are using your medicines or devices correctly.  Get help right away if you are taking or injecting medicines and you have an erection that lasts longer than 4 hours. This information is not intended to replace advice given to you by your health care provider. Make sure you discuss any questions you have with  your health care provider. Document Revised: 12/29/2016 Document Reviewed: 02/02/2016 Elsevier Patient Education  2020 Elsevier Inc.  

## 2019-08-25 NOTE — Progress Notes (Signed)

## 2019-08-25 NOTE — Progress Notes (Signed)
08/25/2019 2:56 PM   Joshua Dawson [birth year] [id number]  Referring provider: Donita Brooks, MD [address] [place],  [place] [address]  Erectile dysfunction  HPI: Joshua Dawson is a 38yo here for followup for hypogonadism and erectile dysfunction. Testosterone 817, CMP normal, hgb 14.9. Good energy. Since last visit he has increased to sildenafil to 40mg  with better results.   His records from [facility] are as follows: I am having trouble with my erections. HPI: Joshua Dawson is a 39 year-old male established patient who is here for erectile dysfunction.  He first stated noticing pain on approximately 07/30/2013. His symptoms did begin gradually. His symptoms have been worse over the last year.   He does not have difficulties achieving an erection. He does have problems maintaining his erections. His erections are straight.   He does not have premature ejaculation. He does not have trouble reaching climax. He does have anxiety because of the symptoms.   08/22/2016: He has worsening ED for the past 3 years. He has a hx of anabolic steroid use which he took for over 2 years. He quit it 3 years ago. He has decreased desire. He had his testosterone which was 800 with 1.0% free and SHBG of 100. He has tried Boron, Magnesium and fish oil to lower it without results   11/29/2016: He has good erections with 40mg  sildenafil   05/23/2017: He was using sildenafil with good results.   12/26/2017: He uses sildenafil prn with good results   11/27/2018: He has not needed sildenafil in over 6 months for his ED.   CC: I have a decreased testosterone level. HPI: He has had the symptoms for 2 years. His symptoms have gotten worse over the last year. He does become fatigued easily. He has not gained weight.   His sex drive has decreased. He does have problems with erections.   He has not had injuries to the testicles or scrotum. He has not had scrotal surgery.   11/29/2016: He has  issues with increase SHBG likley due to anabolic steroid use. He has mild ED which he takes sildenafil 40mg    05/23/2017: NO recent labs. no fatigue   11/27/2018: no fatigue. no recent labs      PMH: Past Medical History:  Diagnosis Date  . Allergy   . Arthritis     Surgical History:   Home Medications:  Allergies as of 08/25/2019      Reactions   Amoxicillin Hives      Medication List       Accurate as of August 25, 2019  2:56 PM. If you have any questions, ask your nurse or doctor.        sildenafil 20 MG tablet Commonly known as: REVATIO Take 20 mg by mouth as needed.       Allergies:  Allergies  Allergen Reactions  . Amoxicillin Hives    Family History: Family History  Problem Relation Age of Onset  . Diabetes Father   . Heart disease Father   . Arthritis Maternal Grandmother   . Cancer Maternal Grandmother     Social History:  reports that he has never smoked. His smokeless tobacco use includes chew. He reports that he does not drink alcohol and does not use drugs.  ROS: All other review of systems were reviewed and are negative except what is noted above in HPI  Physical Exam: BP (!) 101/63   Pulse 73   Temp 97.9 F (36.6  C)   Ht 6' (1.829 m)   Wt 190 lb (86.2 kg)   BMI 25.77 kg/m   Constitutional:  Alert and oriented, No acute distress. HEENT: [name] AT, moist mucus membranes.  Trachea midline, no masses. Cardiovascular: No clubbing, cyanosis, or edema. Respiratory: Normal respiratory effort, no increased work of breathing. GI: Abdomen is soft, nontender, nondistended, no abdominal masses GU: No CVA tenderness.  Lymph: No cervical or inguinal lymphadenopathy. Skin: No rashes, bruises or suspicious lesions. Neurologic: Grossly intact, no focal deficits, moving all 4 extremities. Psychiatric: Normal mood and affect.  Laboratory Data: Lab Results  Component Value Date   WBC 7.3 07/11/2019   HGB 14.9 07/11/2019   HCT 44.0 07/11/2019    MCV 89.1 07/11/2019   PLT 185 07/11/2019    Lab Results  Component Value Date   CREATININE 1.03 07/11/2019    No results found for: PSA  Lab Results  Component Value Date   TESTOSTERONE 817 07/11/2019    No results found for: HGBA1C  Urinalysis No results found for: COLORURINE, APPEARANCEUR, LABSPEC, PHURINE, GLUCOSEU, HGBUR, BILIRUBINUR, KETONESUR, PROTEINUR, UROBILINOGEN, NITRITE, LEUKOCYTESUR  No results found for: LABMICR, WBCUA, RBCUA, LABEPIT, MUCUS, BACTERIA  Pertinent Imaging:  No results found for this or any previous visit.  No results found for this or any previous visit.  No results found for this or any previous visit.  No results found for this or any previous visit.  No results found for this or any previous visit.  No results found for this or any previous visit.  No results found for this or any previous visit.  No results found for this or any previous visit.   Assessment & Plan:    1. Primary hypogonadism in male -We discussed testosterone replacement therapy as an option for his hypogonadism. We did discuss the fact that testosterone replacement is approved only for men who have low testosterone levels caused by certain medical conditions. Currently the benefit and safety of these medications have not been established for the treatment of low testosterone levels due to other causes such as aging, even if a mans symptoms seem related to low testosterone. In addition the FDA has determined that there may be an increased cardiovascular risk including the risk of MI, stroke and DVT with testosterone replacement therapy although the current data is weak. I went over the fact that this form of therapy does not cause prostate cancer but can cause prostate cancer, if present, to experience an increased rate of growth. He understands that this has the potential to improve his libido and also result in some improvement of his erectile dysfunction but is not a  treatment for erectile dysfunction.   I discussed with him the need for a confirmatory morning testosterone level. In addition I will check his pituitary gonadal axis. We discussed restarting testosterone replacement and he is interested in using 1 of the topicals again. I will need to recheck his testosterone level once he is on this medication.  His testosterone levels are normal. We have decided not to pursue TRT  2. Erectile dysfunction -sildenafil prn   No follow-ups on file.  Wilkie Aye, MD  [facility] [place]

## 2019-11-19 ENCOUNTER — Encounter: Payer: Self-pay | Admitting: Urology

## 2019-11-19 ENCOUNTER — Other Ambulatory Visit: Payer: Self-pay

## 2019-11-19 ENCOUNTER — Ambulatory Visit (INDEPENDENT_AMBULATORY_CARE_PROVIDER_SITE_OTHER): Payer: BC Managed Care – PPO | Admitting: Urology

## 2019-11-19 VITALS — BP 132/73 | HR 66 | Temp 97.9°F

## 2019-11-19 DIAGNOSIS — L291 Pruritus scroti: Secondary | ICD-10-CM

## 2019-11-19 LAB — URINALYSIS, ROUTINE W REFLEX MICROSCOPIC
Bilirubin, UA: NEGATIVE
Glucose, UA: NEGATIVE
Ketones, UA: NEGATIVE
Leukocytes,UA: NEGATIVE
Nitrite, UA: NEGATIVE
Protein,UA: NEGATIVE
RBC, UA: NEGATIVE
Specific Gravity, UA: 1.02 (ref 1.005–1.030)
Urobilinogen, Ur: 0.2 mg/dL (ref 0.2–1.0)
pH, UA: 6.5 (ref 5.0–7.5)

## 2019-11-19 MED ORDER — CLOTRIMAZOLE-BETAMETHASONE 1-0.05 % EX CREA: 1.0000 "application " | TOPICAL_CREAM | Freq: Two times a day (BID) | CUTANEOUS | 3 refills | Status: DC

## 2019-11-19 MED ORDER — SILDENAFIL CITRATE 20 MG PO TABS: 20.0000 mg | ORAL_TABLET | ORAL | 3 refills | Status: DC | PRN

## 2019-11-19 NOTE — Patient Instructions (Signed)
Jock Itch  Jock itch is an itchy rash in the groin and upper thigh area. It is a skin infection that is caused by a type of germ that lives in dark, damp places (fungus). The rash usually goes away in 2-3 weeks with treatment. Follow these instructions at home: Skin care  Use skin creams, ointments, or powders exactly as told by your doctor.  Wear loose-fitting clothes. Clothes should not rub against your groin area. Men should wear boxer shorts or loose-fitting underwear.  Keep your groin area clean and dry. ? Change your underwear every day. ? Change out of wet bathing suits as soon as you can. ? After bathing, use a separate towel to dry your groin area. Dry the area gently and completely.  Avoid hot baths and showers. Hot water can make itching worse.  Do not scratch the area. General instructions  Take and apply over-the-counter and prescription medicines only as told by your doctor.  Do not share towels or clothing with other people.  Wash your hands often with soap and water, especially after touching your groin area. If you do not have soap and water, use alcohol-based hand sanitizer. Contact a doctor if:  Your rash: ? Gets worse. ? Does not get better after 2 weeks of treatment. ? Spreads. ? Comes back after treatment is done.  You have any of the following: ? A fever. ? New or worsening redness, swelling, or pain around your rash. ? Fluid, blood, or pus coming from your rash. Summary  Jock itch is an itchy rash. It affects the groin and upper thigh area.  Jock itch usually goes away in 2-3 weeks with treatment.  Keep your groin area clean and dry. This information is not intended to replace advice given to you by your health care provider. Make sure you discuss any questions you have with your health care provider. Document Revised: 12/29/2016 Document Reviewed: 12/27/2016 Elsevier Patient Education  2020 Elsevier Inc.  

## 2019-11-19 NOTE — Progress Notes (Signed)
Urological Symptom Review  Patient is experiencing the following symptoms: Frequent urination   Review of Systems  Gastrointestinal (upper)  : Negative for upper GI symptoms  Gastrointestinal (lower) : Negative for lower GI symptoms  Constitutional : Negative for symptoms  Skin: Itching  Eyes: Negative for eye symptoms  Ear/Nose/Throat : Negative for Ear/Nose/Throat symptoms  Hematologic/Lymphatic: Negative for Hematologic/Lymphatic symptoms  Cardiovascular : Negative for cardiovascular symptoms  Respiratory : Negative for respiratory symptoms  Endocrine: Negative for endocrine symptoms  Musculoskeletal: Negative for musculoskeletal symptoms  Neurological: Negative for neurological symptoms  Psychologic: Negative for psychiatric symptoms

## 2019-11-19 NOTE — Progress Notes (Signed)
11/19/2019 10:12 AM   [name] 1980-12-07 [id number]  Referring provider: Donita Brooks, MD [address] [place],  [place] [address]  Scrotal itching   HPI: Mr Hopping is a 39yo here for new onset scrotal itching. The intermittent itching started 3-4 months ago but now it has worsened the past 2 weeks. He has intermittent penile itching, scortal itching and perineal itching. No exacerbating/alleviating events   PMH: Past Medical History:  Diagnosis Date  . Allergy   . Arthritis     Surgical History: No past surgical history on file.  Home Medications:  Allergies as of 11/19/2019      Reactions   Amoxicillin Hives      Medication List       Accurate as of November 19, 2019 10:12 AM. If you have any questions, ask your nurse or doctor.        sildenafil 20 MG tablet Commonly known as: REVATIO Take 1 tablet (20 mg total) by mouth as needed.       Allergies:  Allergies  Allergen Reactions  . Amoxicillin Hives    Family History: Family History  Problem Relation Age of Onset  . Diabetes Father   . Heart disease Father   . Arthritis Maternal Grandmother   . Cancer Maternal Grandmother     Social History:  reports that he has never smoked. His smokeless tobacco use includes chew. He reports that he does not drink alcohol and does not use drugs.  ROS: All other review of systems were reviewed and are negative except what is noted above in HPI  Physical Exam: BP 132/73   Pulse 66   Temp 97.9 F (36.6 C)   Constitutional:  Alert and oriented, No acute distress. HEENT: [place] AT, moist mucus membranes.  Trachea midline, no masses. Cardiovascular: No clubbing, cyanosis, or edema. Respiratory: Normal respiratory effort, no increased work of breathing. GI: Abdomen is soft, nontender, nondistended, no abdominal masses GU: No CVA tenderness. Circumcised phallus. No masses/lesions on penis, testis, scrotum. Prostate 40g smooth no nodules no  induration.  Lymph: No cervical or inguinal lymphadenopathy. Skin: No rashes, bruises or suspicious lesions. Neurologic: Grossly intact, no focal deficits, moving all 4 extremities. Psychiatric: Normal mood and affect.  Laboratory Data: Lab Results  Component Value Date   WBC 7.3 07/11/2019   HGB 14.9 07/11/2019   HCT 44.0 07/11/2019   MCV 89.1 07/11/2019   PLT 185 07/11/2019    Lab Results  Component Value Date   CREATININE 1.03 07/11/2019    No results found for: PSA  Lab Results  Component Value Date   TESTOSTERONE 817 07/11/2019    No results found for: HGBA1C  Urinalysis No results found for: COLORURINE, APPEARANCEUR, LABSPEC, PHURINE, GLUCOSEU, HGBUR, BILIRUBINUR, KETONESUR, PROTEINUR, UROBILINOGEN, NITRITE, LEUKOCYTESUR  No results found for: LABMICR, WBCUA, RBCUA, LABEPIT, MUCUS, BACTERIA  Pertinent Imaging:  No results found for this or any previous visit.  No results found for this or any previous visit.  No results found for this or any previous visit.  No results found for this or any previous visit.  No results found for this or any previous visit.  No results found for this or any previous visit.  No results found for this or any previous visit.  No results found for this or any previous visit.   Assessment & Plan:    1. Scrotal itching -clotrimazole BID for 21 days - Urinalysis, Routine w reflex microscopic  No follow-ups on file.  Nicolette Bang, MD  [facility] [place]

## 2019-12-11 ENCOUNTER — Telehealth: Payer: Self-pay

## 2019-12-11 ENCOUNTER — Other Ambulatory Visit: Payer: Self-pay

## 2019-12-11 DIAGNOSIS — L291 Pruritus scroti: Secondary | ICD-10-CM

## 2019-12-11 MED ORDER — NYSTATIN-TRIAMCINOLONE 100000-0.1 UNIT/GM-% EX OINT: 1.0000 "application " | TOPICAL_OINTMENT | Freq: Two times a day (BID) | CUTANEOUS | 0 refills | Status: DC

## 2019-12-11 NOTE — Telephone Encounter (Signed)
Pt called saying a cream that Dr. Ronne Binning sent in for his scrotal itching last time he was seen was not working. I talked with Dr. Ronne Binning and he ordered  Nystatin instead. Rx sent. Pt notified.

## 2020-01-15 ENCOUNTER — Telehealth: Payer: Self-pay

## 2020-01-15 ENCOUNTER — Other Ambulatory Visit: Payer: Self-pay

## 2020-01-15 MED ORDER — HYDROXYZINE HCL 10 MG PO TABS
10.0000 mg | ORAL_TABLET | Freq: Every day | ORAL | 3 refills | Status: DC
Start: 2020-01-15 — End: 2020-03-03

## 2020-01-15 NOTE — Telephone Encounter (Signed)
Pt called and said he had used 4 tubes of cream altogether from 2 different rxes Dr. Ronne Binning had sent in for jock itch. He still was experiencing some itching. The cream had helped the skin he said. Spoke with Dr. Ronne Binning. He had Hydroxyzine sent in. Pt asked several questions about could the skin condition be an STD or something else instead. Dr. Ronne Binning heard whole phone conversation and assured it was not that.

## 2020-02-24 ENCOUNTER — Other Ambulatory Visit: Payer: Self-pay

## 2020-02-24 ENCOUNTER — Other Ambulatory Visit: Payer: BC Managed Care – PPO

## 2020-02-24 DIAGNOSIS — E291 Testicular hypofunction: Secondary | ICD-10-CM | POA: Diagnosis not present

## 2020-02-26 LAB — TESTOSTERONE,FREE AND TOTAL
Testosterone, Free: 15.7 pg/mL (ref 8.7–25.1)
Testosterone: 734 ng/dL (ref 264–916)

## 2020-03-03 ENCOUNTER — Ambulatory Visit (INDEPENDENT_AMBULATORY_CARE_PROVIDER_SITE_OTHER): Payer: BC Managed Care – PPO | Admitting: Urology

## 2020-03-03 ENCOUNTER — Telehealth: Payer: Self-pay

## 2020-03-03 ENCOUNTER — Other Ambulatory Visit: Payer: Self-pay

## 2020-03-03 ENCOUNTER — Encounter: Payer: Self-pay | Admitting: Urology

## 2020-03-03 VITALS — BP 116/69 | HR 79 | Temp 98.6°F | Ht 72.0 in | Wt 200.0 lb

## 2020-03-03 DIAGNOSIS — L291 Pruritus scroti: Secondary | ICD-10-CM | POA: Diagnosis not present

## 2020-03-03 DIAGNOSIS — N528 Other male erectile dysfunction: Secondary | ICD-10-CM | POA: Diagnosis not present

## 2020-03-03 DIAGNOSIS — E291 Testicular hypofunction: Secondary | ICD-10-CM

## 2020-03-03 LAB — URINALYSIS, ROUTINE W REFLEX MICROSCOPIC
Bilirubin, UA: NEGATIVE
Glucose, UA: NEGATIVE
Ketones, UA: NEGATIVE
Leukocytes,UA: NEGATIVE
Nitrite, UA: NEGATIVE
Protein,UA: NEGATIVE
RBC, UA: NEGATIVE
Specific Gravity, UA: 1.01 (ref 1.005–1.030)
Urobilinogen, Ur: 0.2 mg/dL (ref 0.2–1.0)
pH, UA: 7 (ref 5.0–7.5)

## 2020-03-03 MED ORDER — HYDROXYZINE HCL 10 MG PO TABS: 10.0000 mg | ORAL_TABLET | Freq: Every day | ORAL | 3 refills | Status: DC

## 2020-03-03 MED ORDER — NYSTATIN-TRIAMCINOLONE 100000-0.1 UNIT/GM-% EX OINT: 1.0000 "application " | TOPICAL_OINTMENT | Freq: Two times a day (BID) | CUTANEOUS | 5 refills | Status: DC

## 2020-03-03 MED ORDER — FLUCONAZOLE 100 MG PO TABS: 100.0000 mg | ORAL_TABLET | Freq: Every day | ORAL | 0 refills | Status: DC

## 2020-03-03 NOTE — Telephone Encounter (Signed)
Pt had left message asking someone to call him back. Attempted to but no answer. Left message for him to call back.

## 2020-03-03 NOTE — Progress Notes (Signed)

## 2020-03-03 NOTE — Progress Notes (Signed)
03/03/2020 10:31 AM   [name] [birth year] [id number]  Referring provider: Donita Brooks, MD [address] [place],  [place] [address]  Scrotal itching  HPI: [name] is a 40yo here for followup for scrotal itching. His scrotal itching improved with nystatin and then returned after he stopped the cream. No other associated symptoms. He has intermittent discomfort with ejaculation. Testosterone free and total normal. No other complaints today. He uses sildenafil prn with good results.    PMH: Past Medical History:  Diagnosis Date  . Allergy   . Arthritis     Surgical History: History reviewed. No pertinent surgical history.  Home Medications:  Allergies as of 03/03/2020      Reactions   Amoxicillin Hives      Medication List       Accurate as of March 03, 2020 10:31 AM. If you have any questions, ask your nurse or doctor.        clotrimazole-betamethasone cream Commonly known as: Lotrisone Apply 1 application topically 2 (two) times daily.   hydrOXYzine 10 MG tablet Commonly known as: ATARAX/VISTARIL Take 1 tablet (10 mg total) by mouth at bedtime.   nystatin-triamcinolone ointment Commonly known as: MYCOLOG Apply 1 application topically 2 (two) times daily.   sildenafil 20 MG tablet Commonly known as: REVATIO Take 1 tablet (20 mg total) by mouth as needed.       Allergies:  Allergies  Allergen Reactions  . Amoxicillin Hives    Family History: Family History  Problem Relation Age of Onset  . Diabetes Father   . Heart disease Father   . Arthritis Maternal Grandmother   . Cancer Maternal Grandmother     Social History:  reports that he has never smoked. His smokeless tobacco use includes chew. He reports that he does not drink alcohol and does not use drugs.  ROS: All other review of systems were reviewed and are negative except what is noted above in HPI  Physical Exam: BP 116/69   Pulse 79   Temp 98.6 F (37 C)    Ht 6' (1.829 m)   Wt 200 lb (90.7 kg)   BMI 27.12 kg/m   Constitutional:  Alert and oriented, No acute distress. HEENT: [place] AT, moist mucus membranes.  Trachea midline, no masses. Cardiovascular: No clubbing, cyanosis, or edema. Respiratory: Normal respiratory effort, no increased work of breathing. GI: Abdomen is soft, nontender, nondistended, no abdominal masses GU: No CVA tenderness.  Lymph: No cervical or inguinal lymphadenopathy. Skin: No rashes, bruises or suspicious lesions. Neurologic: Grossly intact, no focal deficits, moving all 4 extremities. Psychiatric: Normal mood and affect.  Laboratory Data: Lab Results  Component Value Date   WBC 7.3 07/11/2019   HGB 14.9 07/11/2019   HCT 44.0 07/11/2019   MCV 89.1 07/11/2019   PLT 185 07/11/2019    Lab Results  Component Value Date   CREATININE 1.03 07/11/2019    No results found for: PSA  Lab Results  Component Value Date   TESTOSTERONE 734 02/24/2020    No results found for: HGBA1C  Urinalysis    Component Value Date/Time   APPEARANCEUR Clear 11/19/2019 0958   GLUCOSEU Negative 11/19/2019 0958   BILIRUBINUR Negative 11/19/2019 0958   PROTEINUR Negative 11/19/2019 0958   NITRITE Negative 11/19/2019 0958   LEUKOCYTESUR Negative 11/19/2019 0958    Lab Results  Component Value Date   LABMICR Comment 11/19/2019    Pertinent Imaging:  No results found for this or  any previous visit.  No results found for this or any previous visit.  No results found for this or any previous visit.  No results found for this or any previous visit.  No results found for this or any previous visit.  No results found for this or any previous visit.  No results found for this or any previous visit.  No results found for this or any previous visit.   Assessment & Plan:    1. Primary hypogonadism in male -RTC 1 year with testosterone - Urinalysis, Routine w reflex microscopic  2. Scrotal itching -diflucan 100mg   daily for 7 days  3. Other male erectile dysfunction -sildenafil prn   No follow-ups on file.  , MD  [facility] [place]

## 2020-04-01 ENCOUNTER — Telehealth: Payer: Self-pay

## 2020-04-01 NOTE — Telephone Encounter (Signed)
Patient called and said scrotal itching is back after completing 7 days of diflucan.  Last given on 03/08/2020  Also reporting iching/burning with urination.  Message sent to  MD

## 2020-04-02 NOTE — Telephone Encounter (Signed)
Have him try flomax 0.4mg  daily

## 2020-04-05 ENCOUNTER — Other Ambulatory Visit: Payer: Self-pay

## 2020-04-05 DIAGNOSIS — R300 Dysuria: Secondary | ICD-10-CM

## 2020-04-05 MED ORDER — TAMSULOSIN HCL 0.4 MG PO CAPS: 0.4000 mg | ORAL_CAPSULE | Freq: Every day | ORAL | 1 refills | Status: DC

## 2020-04-05 NOTE — Telephone Encounter (Signed)
Prescription sent to [facility].  Called patient and left message.

## 2020-05-04 ENCOUNTER — Encounter: Payer: Self-pay | Admitting: Family Medicine

## 2020-05-04 ENCOUNTER — Other Ambulatory Visit: Payer: Self-pay

## 2020-05-04 ENCOUNTER — Ambulatory Visit (INDEPENDENT_AMBULATORY_CARE_PROVIDER_SITE_OTHER): Payer: BC Managed Care – PPO | Admitting: Family Medicine

## 2020-05-04 VITALS — BP 118/66 | HR 80 | Temp 98.1°F | Resp 16 | Ht 72.0 in | Wt 200.0 lb

## 2020-05-04 DIAGNOSIS — T33521A Superficial frostbite of right hand, initial encounter: Secondary | ICD-10-CM

## 2020-05-04 DIAGNOSIS — T3390XA Superficial frostbite of unspecified sites, initial encounter: Secondary | ICD-10-CM

## 2020-05-04 MED ORDER — SILVER SULFADIAZINE 1 % EX CREA: 1.0000 "application " | TOPICAL_CREAM | Freq: Every day | CUTANEOUS | 0 refills | Status: DC
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
## 2020-08-25 ENCOUNTER — Other Ambulatory Visit: Payer: Self-pay

## 2020-08-25 ENCOUNTER — Telehealth: Payer: Self-pay

## 2020-08-25 ENCOUNTER — Other Ambulatory Visit: Payer: BC Managed Care – PPO

## 2020-08-25 NOTE — Telephone Encounter (Signed)
Patient arrived for a lab appt today and blood was drawn by lab  After reviewing chart patient was scheduled too early. Patient should be an one year f/u.  Pt did not wish to proceed with blood work. New appts created for patient.

## 2020-09-01 ENCOUNTER — Ambulatory Visit: Payer: BC Managed Care – PPO | Admitting: Urology

## 2020-11-10 ENCOUNTER — Other Ambulatory Visit: Payer: Self-pay | Admitting: Urology

## 2021-01-06 ENCOUNTER — Ambulatory Visit: Payer: BC Managed Care – PPO | Admitting: Family Medicine

## 2021-01-21 ENCOUNTER — Other Ambulatory Visit: Payer: Self-pay

## 2021-01-21 ENCOUNTER — Encounter: Payer: Self-pay | Admitting: Family Medicine

## 2021-01-21 ENCOUNTER — Ambulatory Visit (INDEPENDENT_AMBULATORY_CARE_PROVIDER_SITE_OTHER): Payer: BC Managed Care – PPO | Admitting: Family Medicine

## 2021-01-21 VITALS — BP 122/72 | HR 74 | Temp 97.3°F | Resp 18 | Ht 72.0 in | Wt 202.0 lb

## 2021-01-21 DIAGNOSIS — R002 Palpitations: Secondary | ICD-10-CM

## 2021-01-21 MED ORDER — SILDENAFIL CITRATE 20 MG PO TABS: 20.0000 mg | ORAL_TABLET | ORAL | 0 refills | Status: DC | PRN

## 2021-01-21 NOTE — Progress Notes (Signed)
Subjective:    Patient ID: Joshua Dawson, male    DOB: 1980/12/04, 40 y.o.   MRN: [id number]  HPI Approximately 1 month ago, the patient decided to stop dipping.  As result he started nicotine replacement therapy.  Once he started the nicotine patches, he started noticing palpitations.  He describes them as a flip-flop sensation in his chest.  They were occur about every 30 or 40 heartbeats.  It was consistently lasting however for 24 hours.  Was very disconcerting for him.  He denies any syncope or near syncope or chest pain or shortness of breath or dyspnea on exertion.  He independently decided to stop the nicotine patches and the palpitations have stopped since that time.  He denies using any other stimulants.  He does drink caffeine.  He is not on any other supplements. Past Medical History:  Diagnosis Date   Allergy    Arthritis    Psh No current outpatient medications on file prior to visit.   No current facility-administered medications on file prior to visit.   Allergies  Allergen Reactions   Amoxicillin Hives   Social History   Socioeconomic History   Marital status: Married    Spouse name: Not on file   Number of children: Not on file   Years of education: Not on file   Highest education level: Not on file  Occupational History   Not on file  Tobacco Use   Smoking status: Never   Smokeless tobacco: Current    Types: Chew  Substance and Sexual Activity   Alcohol use: No   Drug use: No   Sexual activity: Yes  Other Topics Concern   Not on file  Social History Narrative   Not on file   Social Determinants of Health   Financial Resource Strain: Not on file  Food Insecurity: Not on file  Transportation Needs: Not on file  Physical Activity: Not on file  Stress: Not on file  Social Connections: Not on file  Intimate Partner Violence: Not on file      Review of Systems     Objective:   Physical Exam Vitals reviewed.  Constitutional:       Appearance: Normal appearance.  Neck:     Thyroid: No thyromegaly or thyroid tenderness.  Cardiovascular:     Rate and Rhythm: Normal rate and regular rhythm.     Pulses: Normal pulses.     Heart sounds: Normal heart sounds. No murmur heard.   No friction rub. No gallop.  Pulmonary:     Effort: Pulmonary effort is normal. No respiratory distress.     Breath sounds: Normal breath sounds. No wheezing, rhonchi or rales.  Neurological:     Mental Status: He is alert.          Assessment & Plan:  Palpitations - Plan: EKG 12-Lead, CBC with Differential/Platelet, COMPLETE METABOLIC PANEL WITH GFR, TSH EKG today shows normal sinus rhythm with normal intervals and a normal axis.  Based on his history, I suspect PVCs or PACs.  We discussed a referral to cardiology for a Holter monitor.  However the patient thinks that it was most likely PVCs related to the nicotine.  Since he stopped the nicotine and has stopped happening and he feels better.  I will check a CMP today to monitor for any electrolyte abnormalities and also check a TSH.  We discussed starting Toprol to prevent PVCs however based on the side effect profile of Toprol, the patient prefers  not to at the present time.  He did ask for refill on sildenafil.  He takes 20 mg tablets 1-2 as needed.  I will be glad to refill this.  Patient will consider a Holter monitor and will call me back if he changes his mind wants me to schedule

## 2021-01-22 LAB — COMPLETE METABOLIC PANEL WITH GFR
AG Ratio: 2 (calc) (ref 1.0–2.5)
ALT: 22 U/L (ref 9–46)
AST: 26 U/L (ref 10–40)
Albumin: 4.5 g/dL (ref 3.6–5.1)
Alkaline phosphatase (APISO): 68 U/L (ref 36–130)
BUN: 19 mg/dL (ref 7–25)
CO2: 27 mmol/L (ref 20–32)
Calcium: 9.5 mg/dL (ref 8.6–10.3)
Chloride: 105 mmol/L (ref 98–110)
Creat: 1.15 mg/dL (ref 0.60–1.26)
Globulin: 2.2 g/dL (calc) (ref 1.9–3.7)
Glucose, Bld: 105 mg/dL — ABNORMAL HIGH (ref 65–99)
Potassium: 4.7 mmol/L (ref 3.5–5.3)
Sodium: 140 mmol/L (ref 135–146)
Total Bilirubin: 0.4 mg/dL (ref 0.2–1.2)
Total Protein: 6.7 g/dL (ref 6.1–8.1)
eGFR: 84 mL/min/{1.73_m2} (ref >=60)

## 2021-01-22 LAB — CBC WITH DIFFERENTIAL/PLATELET
Absolute Monocytes: 541 cells/uL (ref 200–950)
Basophils Absolute: 42 cells/uL (ref 0–200)
Basophils Relative: 0.8 %
Eosinophils Absolute: 329 cells/uL (ref 15–500)
Eosinophils Relative: 6.2 %
HCT: 44.5 % (ref 38.5–50.0)
Hemoglobin: 14.8 g/dL (ref 13.2–17.1)
Lymphs Abs: 1574 cells/uL (ref 850–3900)
MCH: 30.2 pg (ref 27.0–33.0)
MCHC: 33.3 g/dL (ref 32.0–36.0)
MCV: 90.8 fL (ref 80.0–100.0)
MPV: 11.8 fL (ref 7.5–12.5)
Monocytes Relative: 10.2 %
Neutro Abs: 2814 cells/uL (ref 1500–7800)
Neutrophils Relative %: 53.1 %
Platelets: 160 10*3/uL (ref 140–400)
RBC: 4.9 10*6/uL (ref 4.20–5.80)
RDW: 12.3 % (ref 11.0–15.0)
Total Lymphocyte: 29.7 %
WBC: 5.3 10*3/uL (ref 3.8–10.8)

## 2021-01-22 LAB — TSH: TSH: 0.78 mIU/L (ref 0.40–4.50)

## 2021-02-07 ENCOUNTER — Encounter: Payer: BC Managed Care – PPO | Admitting: Family Medicine

## 2021-02-21 ENCOUNTER — Other Ambulatory Visit: Payer: BC Managed Care – PPO

## 2021-02-21 ENCOUNTER — Other Ambulatory Visit: Payer: Self-pay

## 2021-02-21 DIAGNOSIS — E291 Testicular hypofunction: Secondary | ICD-10-CM | POA: Diagnosis not present

## 2021-02-22 LAB — CBC WITH DIFFERENTIAL
Basophils Absolute: 0 10*3/uL (ref 0.0–0.2)
Basos: 1 %
EOS (ABSOLUTE): 0.1 10*3/uL (ref 0.0–0.4)
Eos: 2 %
Hematocrit: 42 % (ref 37.5–51.0)
Hemoglobin: 14.6 g/dL (ref 13.0–17.7)
Immature Grans (Abs): 0 10*3/uL (ref 0.0–0.1)
Immature Granulocytes: 0 %
Lymphocytes Absolute: 1.3 10*3/uL (ref 0.7–3.1)
Lymphs: 24 %
MCH: 31 pg (ref 26.6–33.0)
MCHC: 34.8 g/dL (ref 31.5–35.7)
MCV: 89 fL (ref 79–97)
Monocytes Absolute: 0.4 10*3/uL (ref 0.1–0.9)
Monocytes: 8 %
Neutrophils Absolute: 3.5 10*3/uL (ref 1.4–7.0)
Neutrophils: 65 %
RBC: 4.71 x10E6/uL (ref 4.14–5.80)
RDW: 13 % (ref 11.6–15.4)
WBC: 5.3 10*3/uL (ref 3.4–10.8)

## 2021-02-25 LAB — TESTOSTERONE,FREE AND TOTAL
Testosterone, Free: 13.3 pg/mL (ref 8.7–25.1)
Testosterone: 760 ng/dL (ref 264–916)

## 2021-03-02 ENCOUNTER — Ambulatory Visit (INDEPENDENT_AMBULATORY_CARE_PROVIDER_SITE_OTHER): Payer: BC Managed Care – PPO | Admitting: Urology

## 2021-03-02 ENCOUNTER — Encounter: Payer: Self-pay | Admitting: Urology

## 2021-03-02 ENCOUNTER — Other Ambulatory Visit: Payer: Self-pay

## 2021-03-02 VITALS — BP 119/70 | HR 73

## 2021-03-02 DIAGNOSIS — L291 Pruritus scroti: Secondary | ICD-10-CM | POA: Diagnosis not present

## 2021-03-02 DIAGNOSIS — E291 Testicular hypofunction: Secondary | ICD-10-CM

## 2021-03-02 DIAGNOSIS — N528 Other male erectile dysfunction: Secondary | ICD-10-CM

## 2021-03-02 MED ORDER — SILDENAFIL CITRATE 20 MG PO TABS: 20.0000 mg | ORAL_TABLET | ORAL | 3 refills | Status: DC | PRN

## 2021-03-02 NOTE — Patient Instructions (Signed)
Hypogonadism, Male ?Male hypogonadism is a condition of having a level of testosterone that is lower than normal. Testosterone is a chemical, or hormone, that is made mainly in the testicles. ?In boys, testosterone is responsible for the development of male characteristics during puberty. These include: ?Making the penis bigger. ?Growing and building the muscles. ?Growing facial hair. ?Deepening the voice. ?In adult men, testosterone is responsible for maintaining: ?An interest in sex and the ability to have sex. ?Muscle mass. ?Sperm production. ?Red blood cell production. ?Bone strength. ?Testosterone also gives men energy and a sense of well-being. ?Testosterone normally decreases as men age and the testicles make less testosterone. Testosterone levels can vary from man to man. Not all men will have signs and symptoms of low testosterone. Weight, alcohol use, medicines, and certain medical conditions can affect a man's testosterone level. ?What are the causes? ?This condition is caused by: ?A natural decrease in testosterone that occurs as a man grows older. This is the main cause of this condition. ?Use of medicines, such as antidepressants, steroids, and opioids. ?Diseases and conditions that affect the testicles or the making of testosterone. These include: ?Injury or damage to the testicles from trauma, cancer, cancer treatment, or infection. ?Diabetes. ?Sleep apnea. ?Genetic conditions that men are born with. ?Disease of the pituitary gland. This gland is in the brain. It produces hormones. ?Obesity. ?Metabolic syndrome. This is a group of diseases that affect blood pressure, blood sugar, cholesterol, and belly fat. ?HIV or AIDS. ?Alcohol abuse. ?Kidney failure. ?Other long-term or chronic diseases. ?What are the signs or symptoms? ?Common symptoms of this condition include: ?Loss of interest in sex (low sex drive). ?Inability to have or maintain an erection (erectile dysfunction). ?Feeling tired  (fatigue). ?Mood changes, like irritability or depression. ?Loss of muscle and body hair. ?Infertility. ?Large breasts. ?Weight gain (obesity). ?How is this diagnosed? ?Your health care provider can diagnose hypogonadism based on: ?Your signs and symptoms. ?A physical exam to check your testosterone levels. This includes blood tests. Testosterone levels can change throughout the day. Levels are highest in the morning. You may need to have repeat blood tests before getting a diagnosis of hypogonadism. ?Depending on your medical history and test results, your health care provider may also do other tests to find the cause of low testosterone. ?How is this treated? ?This condition is treated with testosterone replacement therapy. Testosterone can be given by: ?Injection or through pellets inserted under the skin. ?Gels or patches placed on the skin or in the mouth. ?Testosterone therapy is not for everyone. It has risks and side effects. Your health care provider will consider your medical history, your risk for prostate cancer, your age, and your symptoms before putting you on testosterone replacement therapy. ?Follow these instructions at home: ?Take over-the-counter and prescription medicines only as told by your health care provider. ?Eat foods that are high in fiber, such as beans, whole grains, and fresh fruits and vegetables. Limit foods that are high in fat and processed sugars, such as fried or sweet foods. ?If you drink alcohol: ?Limit how much you have to 0-2 drinks a day. ?Know how much alcohol is in your drink. In the [place] one drink equals one 12 oz bottle of beer (355 mL), one 5 oz glass of wine (148 mL), or one 1? oz glass of hard liquor (44 mL). ?Return to your normal activities as told by your health care provider. Ask your health care provider what activities are safe for you. ?Keep all follow-up   visits. This is important. ?Contact a health care provider if: ?You have any of the signs or symptoms of  low testosterone. ?You have any side effects from testosterone therapy. ?Summary ?Male hypogonadism is a condition of having a level of testosterone that is lower than normal. ?The natural drop in testosterone production that occurs with age is the most common cause of this condition. ?Low testosterone can also be caused by many diseases and conditions that affect the testicles and the making of testosterone. ?This condition is treated with testosterone replacement therapy. ?There are risks and side effects of testosterone therapy. Your health care provider will consider your age, medical history, symptoms, and risks for prostate cancer before putting you on testosterone therapy. ?This information is not intended to replace advice given to you by your health care provider. Make sure you discuss any questions you have with your health care provider. ?Document Revised: 09/18/2019 Document Reviewed: 09/18/2019 ?Elsevier Patient Education ? 2022 [employer]vier Inc[employer] ? ?

## 2021-03-02 NOTE — Progress Notes (Signed)
03/02/2021 11:50 AM   [name] 1980/04/05 [id number]  Referring provider: Donita Brooks, MD [address] [place],  [place] [address]  Followup hypogonadism and Erectile dysfunction   HPI: Mr Kunath is a 40yo here for followup for hypogonadism and erectile dysfunction.  Testosterone 760 with 13% free.  Good energy. Good libido. He uses sildenafil 20-40mg  with good results. Scrotal itching resolved since last visit. No other complaints.    PMH: Past Medical History:  Diagnosis Date   Allergy    Arthritis     Surgical History: No past surgical history on file.  Home Medications:  Allergies as of 03/02/2021       Reactions   Amoxicillin Hives        Medication List        Accurate as of March 02, 2021 11:50 AM. If you have any questions, ask your nurse or doctor.          sildenafil 20 MG tablet Commonly known as: REVATIO Take 1 tablet (20 mg total) by mouth as needed.        Allergies:  Allergies  Allergen Reactions   Amoxicillin Hives    Family History: Family History  Problem Relation Age of Onset   Diabetes Father    Heart disease Father    Arthritis Maternal Grandmother    Cancer Maternal Grandmother     Social History:  reports that he has never smoked. His smokeless tobacco use includes chew. He reports that he does not drink alcohol and does not use drugs.  ROS: All other review of systems were reviewed and are negative except what is noted above in HPI  Physical Exam: BP 119/70    Pulse 73   Constitutional:  Alert and oriented, No acute distress. HEENT: [place] AT, moist mucus membranes.  Trachea midline, no masses. Cardiovascular: No clubbing, cyanosis, or edema. Respiratory: Normal respiratory effort, no increased work of breathing. GI: Abdomen is soft, nontender, nondistended, no abdominal masses GU: No CVA tenderness.  Lymph: No cervical or inguinal lymphadenopathy. Skin: No rashes, bruises or suspicious  lesions. Neurologic: Grossly intact, no focal deficits, moving all 4 extremities. Psychiatric: Normal mood and affect.  Laboratory Data: Lab Results  Component Value Date   WBC 5.3 02/21/2021   HGB 14.6 02/21/2021   HCT 42.0 02/21/2021   MCV 89 02/21/2021   PLT 160 01/21/2021    Lab Results  Component Value Date   CREATININE 1.15 01/21/2021    No results found for: PSA  Lab Results  Component Value Date   TESTOSTERONE 760 02/21/2021    No results found for: HGBA1C  Urinalysis    Component Value Date/Time   APPEARANCEUR Clear 03/03/2020 1011   GLUCOSEU Negative 03/03/2020 1011   BILIRUBINUR Negative 03/03/2020 1011   PROTEINUR Negative 03/03/2020 1011   NITRITE Negative 03/03/2020 1011   LEUKOCYTESUR Negative 03/03/2020 1011    Lab Results  Component Value Date   LABMICR Comment 03/03/2020    Pertinent Imaging:  No results found for this or any previous visit.  No results found for this or any previous visit.  No results found for this or any previous visit.  No results found for this or any previous visit.  No results found for this or any previous visit.  No results found for this or any previous visit.  No results found for this or any previous visit.  No results found for this or any previous visit.   Assessment &  Plan:    1. Primary hypogonadism in male -Resolved  2. Scrotal itching -resolved  3. Other male erectile dysfunction -Continue sildenafil 20-40 prn   No follow-ups on file.  Wilkie Aye, MD  [facility] [place]

## 2021-03-02 NOTE — Progress Notes (Signed)

## 2021-03-09 ENCOUNTER — Telehealth: Payer: Self-pay

## 2021-03-09 NOTE — Telephone Encounter (Signed)
Error

## 2021-03-25 ENCOUNTER — Encounter: Payer: Self-pay | Admitting: Family Medicine

## 2021-03-25 ENCOUNTER — Other Ambulatory Visit: Payer: Self-pay

## 2021-03-25 ENCOUNTER — Ambulatory Visit (INDEPENDENT_AMBULATORY_CARE_PROVIDER_SITE_OTHER): Payer: BC Managed Care – PPO | Admitting: Family Medicine

## 2021-03-25 ENCOUNTER — Telehealth: Payer: Self-pay | Admitting: Family Medicine

## 2021-03-25 VITALS — BP 124/78 | HR 66 | Temp 97.5°F | Resp 18 | Ht 72.0 in | Wt 196.0 lb

## 2021-03-25 DIAGNOSIS — Z0000 Encounter for general adult medical examination without abnormal findings: Secondary | ICD-10-CM

## 2021-03-25 DIAGNOSIS — G459 Transient cerebral ischemic attack, unspecified: Secondary | ICD-10-CM | POA: Diagnosis not present

## 2021-03-25 LAB — COMPLETE METABOLIC PANEL WITH GFR
AG Ratio: 2 (calc) (ref 1.0–2.5)
ALT: 15 U/L (ref 9–46)
AST: 20 U/L (ref 10–40)
Albumin: 4.7 g/dL (ref 3.6–5.1)
Alkaline phosphatase (APISO): 54 U/L (ref 36–130)
BUN: 17 mg/dL (ref 7–25)
CO2: 27 mmol/L (ref 20–32)
Calcium: 9.6 mg/dL (ref 8.6–10.3)
Chloride: 103 mmol/L (ref 98–110)
Creat: 0.96 mg/dL (ref 0.60–1.26)
Globulin: 2.3 g/dL (calc) (ref 1.9–3.7)
Glucose, Bld: 89 mg/dL (ref 65–99)
Potassium: 4.2 mmol/L (ref 3.5–5.3)
Sodium: 138 mmol/L (ref 135–146)
Total Bilirubin: 0.4 mg/dL (ref 0.2–1.2)
Total Protein: 7 g/dL (ref 6.1–8.1)
eGFR: 104 mL/min/{1.73_m2} (ref >=60)

## 2021-03-25 LAB — CBC WITH DIFFERENTIAL/PLATELET
Absolute Monocytes: 451 cells/uL (ref 200–950)
Basophils Absolute: 28 cells/uL (ref 0–200)
Basophils Relative: 0.5 %
Eosinophils Absolute: 182 cells/uL (ref 15–500)
Eosinophils Relative: 3.3 %
HCT: 42.7 % (ref 38.5–50.0)
Hemoglobin: 14.3 g/dL (ref 13.2–17.1)
Lymphs Abs: 1826 cells/uL (ref 850–3900)
MCH: 30.3 pg (ref 27.0–33.0)
MCHC: 33.5 g/dL (ref 32.0–36.0)
MCV: 90.5 fL (ref 80.0–100.0)
MPV: 10.9 fL (ref 7.5–12.5)
Monocytes Relative: 8.2 %
Neutro Abs: 3014 cells/uL (ref 1500–7800)
Neutrophils Relative %: 54.8 %
Platelets: 187 10*3/uL (ref 140–400)
RBC: 4.72 10*6/uL (ref 4.20–5.80)
RDW: 12.4 % (ref 11.0–15.0)
Total Lymphocyte: 33.2 %
WBC: 5.5 10*3/uL (ref 3.8–10.8)

## 2021-03-25 LAB — LIPID PANEL
Cholesterol: 119 mg/dL (ref <200)
HDL: 47 mg/dL (ref >=40)
LDL Cholesterol (Calc): 60 mg/dL (calc)
Non-HDL Cholesterol (Calc): 72 mg/dL (calc) (ref <130)
Total CHOL/HDL Ratio: 2.5 (calc) (ref <5.0)
Triglycerides: 41 mg/dL (ref <150)

## 2021-03-25 NOTE — Telephone Encounter (Signed)
Patient requesting for bills from 01/21/22 totaling $177.45 to be resubmitted to insurance [facility])  Id# [id number]  Please advise at [phone].
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
## 2021-03-28 ENCOUNTER — Other Ambulatory Visit: Payer: Self-pay

## 2021-03-28 DIAGNOSIS — N528 Other male erectile dysfunction: Secondary | ICD-10-CM

## 2021-03-28 MED ORDER — SILDENAFIL CITRATE 20 MG PO TABS: 20.0000 mg | ORAL_TABLET | ORAL | 3 refills | Status: DC | PRN

## 2021-03-31 ENCOUNTER — Ambulatory Visit
Admission: RE | Admit: 2021-03-31 | Discharge: 2021-03-31 | Disposition: A | Discharge status: Home / Self Care | Payer: BC Managed Care – PPO | Source: Ambulatory Visit | Attending: Family Medicine | Admitting: Family Medicine

## 2021-03-31 ENCOUNTER — Other Ambulatory Visit (HOSPITAL_COMMUNITY): Payer: BC Managed Care – PPO

## 2021-03-31 DIAGNOSIS — G459 Transient cerebral ischemic attack, unspecified: Secondary | ICD-10-CM

## 2021-03-31 DIAGNOSIS — R200 Anesthesia of skin: Secondary | ICD-10-CM | POA: Diagnosis not present

## 2021-03-31 DIAGNOSIS — R4781 Slurred speech: Secondary | ICD-10-CM | POA: Diagnosis not present

## 2021-04-05 ENCOUNTER — Ambulatory Visit (HOSPITAL_COMMUNITY)
Admission: RE | Admit: 2021-04-05 | Discharge: 2021-04-05 | Disposition: A | Discharge status: Home / Self Care | Payer: BC Managed Care – PPO | Source: Ambulatory Visit | Attending: Family Medicine | Admitting: Family Medicine

## 2021-04-05 DIAGNOSIS — G459 Transient cerebral ischemic attack, unspecified: Secondary | ICD-10-CM | POA: Diagnosis not present

## 2021-04-05 LAB — ECHOCARDIOGRAM COMPLETE
Area-P 1/2: 3.03 cm2
S' Lateral: 2.6 cm

## 2021-04-05 NOTE — Progress Notes (Signed)
*  PRELIMINARY RESULTS* ?Echocardiogram ?2D Echocardiogram has been performed. ? ?Stacey Drain ?04/05/2021, 12:00 PM ?

## 2021-04-07 NOTE — Telephone Encounter (Signed)
I have reviewed patients account this amount is patients responsibility per EOB it is a deductible amount. I have called and spoke with patient and he verbalized understanding that he was responsible for the bill. ? ?

## 2021-04-11 ENCOUNTER — Ambulatory Visit
Admission: RE | Admit: 2021-04-11 | Discharge: 2021-04-11 | Disposition: A | Discharge status: Home / Self Care | Payer: BC Managed Care – PPO | Source: Ambulatory Visit | Attending: Family Medicine | Admitting: Family Medicine

## 2021-04-11 DIAGNOSIS — G459 Transient cerebral ischemic attack, unspecified: Secondary | ICD-10-CM

## 2021-04-11 DIAGNOSIS — R42 Dizziness and giddiness: Secondary | ICD-10-CM | POA: Diagnosis not present

## 2021-04-11 DIAGNOSIS — R200 Anesthesia of skin: Secondary | ICD-10-CM | POA: Diagnosis not present

## 2021-04-11 MED ORDER — GADOBENATE DIMEGLUMINE 529 MG/ML IV SOLN
18.0000 mL | Freq: Once | INTRAVENOUS | Status: AC | PRN
  Administered 2021-04-11: 18 mL via INTRAVENOUS

## 2021-04-15 ENCOUNTER — Other Ambulatory Visit: Payer: Self-pay

## 2021-04-15 ENCOUNTER — Ambulatory Visit (INDEPENDENT_AMBULATORY_CARE_PROVIDER_SITE_OTHER): Payer: BC Managed Care – PPO | Admitting: Family Medicine

## 2021-04-15 ENCOUNTER — Encounter: Payer: Self-pay | Admitting: Family Medicine

## 2021-04-15 VITALS — BP 138/82 | HR 78 | Temp 97.7°F | Resp 18 | Ht 72.0 in | Wt 196.0 lb

## 2021-04-15 DIAGNOSIS — F411 Generalized anxiety disorder: Secondary | ICD-10-CM | POA: Diagnosis not present

## 2021-04-15 MED ORDER — ESCITALOPRAM OXALATE 10 MG PO TABS: 10.0000 mg | ORAL_TABLET | Freq: Every day | ORAL | 5 refills | Status: DC

## 2021-04-15 NOTE — Progress Notes (Signed)
? ?Subjective:  ? ? Patient ID: Joshua Dawson, male    DOB: 06/21/1980, 41 y.o.   MRN: [id number] ? ?HPI ?03/25/21 ?I recently saw the patient for palpitations.  After he discontinued nicotine, the palpitations improved.  He is here today for complete physical exam.  However he is scheduled to complete physical exam because he recently had a concerning event.  He admits that he has been drinking alcohol although he was not intoxicated.  He was in an argument with his wife.  He states that while they were arguing, his left arm became completely numb.  He was unable to control his arm.  He could lift it but it felt dead and heavy.  This lasted a minute or so and then resolved spontaneously.  He denies any numbness or weakness in his left leg.  He states that his wife said he was slurring his speech slightly.  However symptoms resolved within a minute.  He denied any facial droop.  He denied any memory loss.  He denied any recent head trauma or concussions.  On examination today cranial nerves II through XII are grossly intact with muscle strength 5/5 in the upper and lower extremity.  There is no neurologic deficit on exam.  At that time, my plan was: ?Differential diagnosis includes some kind of anxiety attack made worse by alcohol triggered by the stress of the argument with his wife, hypertensive urgency, or TIA.  I will institute a work-up for TIA.  I will schedule an MRI of the brain.  I will schedule an echocardiogram with her.  I will schedule carotid Dopplers.  Check a CBC, CMP, and a lipid panel today.  Await results of the above test.  If all of the work-up is normal above, I would focus our attention on controlling anxiety. ? ?04/15/21 ?IMPRESSIONS  ? ? ? 1. Left ventricular ejection fraction, by estimation, is 60 to 65%. The  ?left ventricle has normal function. The left ventricle has no regional  ?wall motion abnormalities. There is mild concentric left ventricular  ?hypertrophy. Left ventricular  diastolic  ?parameters were normal.  ? 2. Right ventricular systolic function is normal. The right ventricular  ?size is normal. Tricuspid regurgitation signal is inadequate for assessing  ?PA pressure.  ? 3. The mitral valve is normal in structure. No evidence of mitral valve  ?regurgitation. No evidence of mitral stenosis.  ? 4. The aortic valve is tricuspid. Aortic valve regurgitation is not  ?visualized. No aortic stenosis is present.  ? 5. The inferior vena cava is normal in size with greater than 50%  ?respiratory variability, suggesting right atrial pressure of 3 mmHg.  ? 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 No acute ?intracranial abnormality. ? ?04/15/21 ?Today's entire office visit was spent in discussion with the patient and his wife.  I reviewed the exact image of the MRI with questionable infarct is located.  Truly I am unable to see any abnormality with mild untrained.  I believe that this is likely an artifact.  Furthermore it would not coincide with the location of his symptoms which was numbness in the left arm.  Therefore I still feel that the majority of his symptoms are related to anxiety.  The patient's wife indicates that he does have an issue with anxiety.  The patient admits that he has an issue relaxing.  He states that he cannot sleep at night.  He  has a difficult time turning off his mind.  He tends to perseverate over fears and worries.  He denies any panic attacks although in the past he has taken Zoloft when he had panic attacks a long time ago. ?  ?Past Medical History:  ?Diagnosis Date  ?? Allergy   ?? Arthritis   ?History reviewed. No pertinent surgical history. ? ?Current Outpatient Medications on File Prior to Visit  ?Medication Sig Dispense Refill  ?? sildenafil (REVATIO) 20 MG tablet Take 1 tablet (20 mg total) by mouth as needed. 30 tablet 3  ? ?No current facility-administered  medications on file prior to visit.  ? ?Allergies  ?Allergen Reactions  ?? Amoxicillin Hives  ? ?Social History  ? ?Socioeconomic History  ?? Marital status: Married  ?  Spouse name: Not on file  ?? Number of children: Not on file  ?? Years of education: Not on file  ?? Highest education level: Not on file  ?Occupational History  ?? Not on file  ?Tobacco Use  ?? Smoking status: Never  ?? Smokeless tobacco: Current  ?  Types: Chew  ?Substance and Sexual Activity  ?? Alcohol use: No  ?? Drug use: No  ?? Sexual activity: Yes  ?Other Topics Concern  ?? Not on file  ?Social History Narrative  ?? Not on file  ? ?Social Determinants of Health  ? ?Financial Resource Strain: Not on file  ?Food Insecurity: Not on file  ?Transportation Needs: Not on file  ?Physical Activity: Not on file  ?Stress: Not on file  ?Social Connections: Not on file  ?Intimate Partner Violence: Not on file  ? ? ? ? ?Review of Systems ? ?   ?Objective:  ? Physical Exam ?Vitals reviewed.  ?Constitutional:   ?   General: He is not in acute distress. ?   Appearance: Normal appearance. He is normal weight. He is not ill-appearing, toxic-appearing or diaphoretic.  ?HENT:  ?   Head: Normocephalic and atraumatic.  ?Neck:  ?   Thyroid: No thyromegaly or thyroid tenderness.  ?Cardiovascular:  ?   Rate and Rhythm: Normal rate and regular rhythm.  ?   Pulses: Normal pulses.  ?   Heart sounds: Normal heart sounds. No murmur heard. ?  No friction rub. No gallop.  ?Pulmonary:  ?   Effort: Pulmonary effort is normal. No respiratory distress.  ?   Breath sounds: Normal breath sounds. No wheezing, rhonchi or rales.  ?Neurological:  ?   General: No focal deficit present.  ?   Mental Status: He is alert and oriented to person, place, and time. Mental status is at baseline.  ? ? ? ? ? ?   ?Assessment & Plan:  ?GAD (generalized anxiety disorder) ?I believe the majority of the patient's symptoms are physical manifestations of anxiety and hypervigilance.  I truly do not  believe the MRI shows any deficits.  His echocardiogram and his carotid Dopplers are reassuring.  Therefore I recommended that we treat the patient for anxiety by starting Lexapro 10 mg a day and then reassessing in 6 weeks.  Patient is willing to try. ?

## 2021-08-16 ENCOUNTER — Ambulatory Visit: Payer: BC Managed Care – PPO | Admitting: Family Medicine

## 2021-11-07 ENCOUNTER — Ambulatory Visit (INDEPENDENT_AMBULATORY_CARE_PROVIDER_SITE_OTHER): Payer: BC Managed Care – PPO | Admitting: Family Medicine

## 2021-11-07 VITALS — BP 134/86 | HR 80 | Temp 97.4°F | Resp 18

## 2021-11-07 DIAGNOSIS — R0683 Snoring: Secondary | ICD-10-CM | POA: Diagnosis not present

## 2021-11-07 DIAGNOSIS — G4710 Hypersomnia, unspecified: Secondary | ICD-10-CM

## 2021-11-07 NOTE — Progress Notes (Signed)
Subjective:    Patient ID: Joshua Dawson, male    DOB: [birth year], 41 y.o.   MRN: [id number]  HPI Please see office visit from March.  At that time I worked the patient up for TIA although I felt the majority of his event was due to alcohol, Benadryl made worse by anxiety.  The patient feels like anxiety is doing better since he started taking Lexapro.  However he continues to experience hypersomnolence.  He snores loudly.  His wife frequently sleeps in another bedroom due to the snoring.  He has occasionally actually woke up gasping for air.  He reports hypersomnolence however he attributes a lot of this to working third shift.  He states that he works third shift and often works during the day and a side job.  Therefore he is always feeling tired.  He denies falling asleep driving or falling asleep riding in a car.  He can fall asleep after lunch or while watching TV depending on what he is watching.  He denies any seizure activity or restless leg activity Past Medical History:  Diagnosis Date   Allergy    Arthritis   No past surgical history on file.  Current Outpatient Medications on File Prior to Visit  Medication Sig Dispense Refill   escitalopram (LEXAPRO) 10 MG tablet Take 1 tablet (10 mg total) by mouth daily. 30 tablet 5   sildenafil (REVATIO) 20 MG tablet Take 1 tablet (20 mg total) by mouth as needed. 30 tablet 3   No current facility-administered medications on file prior to visit.   Allergies  Allergen Reactions   Amoxicillin Hives   Social History   Socioeconomic History   Marital status: Married    Spouse name: Not on file   Number of children: Not on file   Years of education: Not on file   Highest education level: Not on file  Occupational History   Not on file  Tobacco Use   Smoking status: Never   Smokeless tobacco: Current    Types: Chew  Substance and Sexual Activity   Alcohol use: No   Drug use: No   Sexual activity: Yes  Other Topics Concern    Not on file  Social History Narrative   Not on file   Social Determinants of Health   Financial Resource Strain: Not on file  Food Insecurity: Not on file  Transportation Needs: Not on file  Physical Activity: Not on file  Stress: Not on file  Social Connections: Not on file  Intimate Partner Violence: Not on file      Review of Systems     Objective:   Physical Exam Vitals reviewed.  Constitutional:      General: He is not in acute distress.    Appearance: Normal appearance. He is normal weight. He is not ill-appearing, toxic-appearing or diaphoretic.  HENT:     Head: Normocephalic and atraumatic.  Neck:     Thyroid: No thyromegaly or thyroid tenderness.  Cardiovascular:     Rate and Rhythm: Normal rate and regular rhythm.     Pulses: Normal pulses.     Heart sounds: Normal heart sounds. No murmur heard.    No friction rub. No gallop.  Pulmonary:     Effort: Pulmonary effort is normal. No respiratory distress.     Breath sounds: Normal breath sounds. No wheezing, rhonchi or rales.  Neurological:     General: No focal deficit present.     Mental Status: He is  alert and oriented to person, place, and time. Mental status is at baseline.           Assessment & Plan:  Hypersomnolence  Snoring I will schedule patient for sleep study.  Because he works third shift, he is requesting to be done at home.  There follow-up try to schedule a home sleep study for him.

## 2021-11-17 ENCOUNTER — Other Ambulatory Visit: Payer: Self-pay | Admitting: Family Medicine

## 2021-11-18 NOTE — Telephone Encounter (Signed)
Requested Prescriptions  Pending Prescriptions Disp Refills  . escitalopram (LEXAPRO) 10 MG tablet [Pharmacy Med Name: Escitalopram Oxalate 10 MG Oral Tablet] 90 tablet 1    Sig: Take 1 tablet by mouth once daily     Psychiatry:  Antidepressants - SSRI Failed - 11/17/2021  5:17 PM      Failed - Valid encounter within last 6 months    Recent Outpatient Visits          7 months ago GAD (generalized anxiety disorder)   [place] Susy Frizzle, MD   7 months ago TIA (transient ischemic attack)   [place] Pickard, Cammie Mcgee, MD   10 months ago Palpitations   [place] Pickard, Cammie Mcgee, MD   1 year ago [place]bite, initial encounter   [place] Susy Frizzle, MD   5 years ago Migraine with aura and without status migrainosus, not intractable   [place] Pickard, Cammie Mcgee, MD      Future Appointments            In 3 months McKenzie, Candee Furbish, MD [facility] [place]

## 2022-03-01 ENCOUNTER — Other Ambulatory Visit: Payer: Self-pay

## 2022-03-01 ENCOUNTER — Other Ambulatory Visit: Payer: BC Managed Care – PPO

## 2022-03-01 DIAGNOSIS — E291 Testicular hypofunction: Secondary | ICD-10-CM | POA: Diagnosis not present

## 2022-03-03 ENCOUNTER — Encounter: Payer: Self-pay | Admitting: Urology

## 2022-03-03 ENCOUNTER — Ambulatory Visit (INDEPENDENT_AMBULATORY_CARE_PROVIDER_SITE_OTHER): Payer: BC Managed Care – PPO | Admitting: Urology

## 2022-03-03 VITALS — BP 112/75 | HR 88

## 2022-03-03 DIAGNOSIS — E291 Testicular hypofunction: Secondary | ICD-10-CM | POA: Diagnosis not present

## 2022-03-03 DIAGNOSIS — N528 Other male erectile dysfunction: Secondary | ICD-10-CM

## 2022-03-03 MED ORDER — SILDENAFIL CITRATE 20 MG PO TABS: 20.0000 mg | ORAL_TABLET | ORAL | 11 refills | Status: DC | PRN

## 2022-03-03 NOTE — Patient Instructions (Signed)
Erectile Dysfunction Erectile dysfunction (ED) is the inability to get or keep an erection in order to have sexual intercourse. ED is considered a symptom of an underlying disorder and is not considered a disease. ED may include: Inability to get an erection. Lack of enough hardness of the erection to allow penetration. Loss of erection before sex is finished. What are the causes? This condition may be caused by: Physical causes, such as: Artery problems. This may include heart disease, high blood pressure, atherosclerosis, and diabetes. Hormonal problems, such as low testosterone. Obesity. Nerve problems. This may include back or pelvic injuries, multiple sclerosis, Parkinson's disease, spinal cord injury, and stroke. Certain medicines, such as: Pain relievers. Antidepressants. Blood pressure medicines and water pills (diuretics). Cancer medicines. Antihistamines. Muscle relaxants. Lifestyle factors, such as: Use of drugs such as marijuana, cocaine, or opioids. Excessive use of alcohol. Smoking. Lack of physical activity or exercise. Psychological causes, such as: Anxiety or stress. Sadness or depression. Exhaustion. Fear about sexual performance. Guilt. What are the signs or symptoms? Symptoms of this condition include: Inability to get an erection. Lack of enough hardness of the erection to allow penetration. Loss of the erection before sex is finished. Sometimes having normal erections, but with frequent unsatisfactory episodes. Low sexual satisfaction in either partner due to erection problems. A curved penis occurring with erection. The curve may cause pain, or the penis may be too curved to allow for intercourse. Never having nighttime or morning erections. How is this diagnosed? This condition is often diagnosed by: Performing a physical exam to find other diseases or specific problems with the penis. Asking you detailed questions about the problem. Doing tests,  such as: Blood tests to check for diabetes mellitus or high cholesterol, or to measure hormone levels. Other tests to check for underlying health conditions. An ultrasound exam to check for scarring. A test to check blood flow to the penis. Doing a sleep study at home to measure nighttime erections. How is this treated? This condition may be treated by: Medicines, such as: Medicine taken by mouth to help you achieve an erection (oral medicine). Hormone replacement therapy to replace low testosterone levels. Medicine that is injected into the penis. Your health care provider may instruct you how to give yourself these injections at home. Medicine that is delivered with a short applicator tube. The tube is inserted into the opening at the tip of the penis, which is the opening of the urethra. A tiny pellet of medicine is put in the urethra. The pellet dissolves and enhances erectile function. This is also called MUSE (medicated urethral system for erections) therapy. Vacuum pump. This is a pump with a ring on it. The pump and ring are placed on the penis and used to create pressure that helps the penis become erect. Penile implant surgery. In this procedure, you may receive: An inflatable implant. This consists of cylinders, a pump, and a reservoir. The cylinders can be inflated with a fluid that helps to create an erection, and they can be deflated after intercourse. A semi-rigid implant. This consists of two silicone rubber rods. The rods provide some rigidity. They are also flexible, so the penis can both curve downward in its normal position and become straight for sexual intercourse. Blood vessel surgery to improve blood flow to the penis. During this procedure, a blood vessel from a different part of the body is placed into the penis to allow blood to flow around (bypass) damaged or blocked blood vessels. Lifestyle changes,  such as exercising more, losing weight, and quitting smoking. ?Follow  these instructions at home: ?Medicines ? ?Take over-the-counter and prescription medicines only as told by your health care provider. Do not increase the dosage without first discussing it with your health care provider. ?If you are using self-injections, do injections as directed by your health care provider. Make sure you avoid any veins that are on the surface of the penis. After giving an injection, apply pressure to the injection site for 5 minutes. ?Talk to your health care provider about how to prevent headaches while taking ED medicines. These medicines may cause a sudden headache due to the increase in blood flow in your body. ?General instructions ?Exercise regularly, as directed by your health care provider. Work with your health care provider to lose weight, if needed. ?Do not use any products that contain nicotine or tobacco. These products include cigarettes, chewing tobacco, and vaping devices, such as e-cigarettes. If you need help quitting, ask your health care provider. ?Before using a vacuum pump, read the instructions that come with the pump and discuss any questions with your health care provider. ?Keep all follow-up visits. This is important. ?Contact a health care provider if: ?You feel nauseous. ?You are vomiting. ?You get sudden headaches while taking ED medicines. ?You have any concerns about your sexual health. ?Get help right away if: ?You are taking oral or injectable medicines and you have an erection that lasts longer than 4 hours. If your health care provider is unavailable, go to the nearest emergency room for evaluation. An erection that lasts much longer than 4 hours can result in permanent damage to your penis. ?You have severe pain in your groin or abdomen. ?You develop redness or severe swelling of your penis. ?You have redness spreading at your groin or lower abdomen. ?You are unable to urinate. ?You experience chest pain or a rapid heartbeat (palpitations) after taking oral  medicines. ?These symptoms may represent a serious problem that is an emergency. Do not wait to see if the symptoms will go away. Get medical help right away. Call your local emergency services (911 in the [place] Do not drive yourself to the hospital. ?Summary ?Erectile dysfunction (ED) is the inability to get or keep an erection during sexual intercourse. ?This condition is diagnosed based on a physical exam, your symptoms, and tests to determine the cause. Treatment varies depending on the cause and may include medicines, hormone therapy, surgery, or a vacuum pump. ?You may need follow-up visits to make sure that you are using your medicines or devices correctly. ?Get help right away if you are taking or injecting medicines and you have an erection that lasts longer than 4 hours. ?This information is not intended to replace advice given to you by your health care provider. Make sure you discuss any questions you have with your health care provider. ?Document Revised: 04/14/2020 Document Reviewed: 04/14/2020 ?Elsevier Patient Education ? 2023 [employer] ? ?

## 2022-03-03 NOTE — Progress Notes (Signed)
03/03/2022 12:19 PM   Joshua Dawson [birth year] [id number]  Referring provider: Susy Frizzle, MD [address] [place],  [place] [address]  Followup erectile dysfunction    HPI: Joshua Dawson is a 42yo here for followup for hypogonadism and erectile dysfunction. He uses sildenafil 20mg  prn with good results Sometimes he does not require the sildenafil. Testosterone 804.    PMH: Past Medical History:  Diagnosis Date   Allergy    Arthritis     Surgical History: No past surgical history on file.  Home Medications:  Allergies as of 03/03/2022       Reactions   Amoxicillin Hives        Medication List        Accurate as of March 03, 2022 12:19 PM. If you have any questions, ask your nurse or doctor.          escitalopram 10 MG tablet Commonly known as: LEXAPRO Take 1 tablet by mouth once daily   sildenafil 20 MG tablet Commonly known as: REVATIO Take 1 tablet (20 mg total) by mouth as needed.        Allergies:  Allergies  Allergen Reactions   Amoxicillin Hives    Family History: Family History  Problem Relation Age of Onset   Diabetes Father    Heart disease Father    Arthritis Maternal Grandmother    Cancer Maternal Grandmother     Social History:  reports that he has never smoked. His smokeless tobacco use includes chew. He reports that he does not drink alcohol and does not use drugs.  ROS: All other review of systems were reviewed and are negative except what is noted above in HPI  Physical Exam: BP 112/75   Pulse 88   Constitutional:  Alert and oriented, No acute distress. HEENT: [place] AT, moist mucus membranes.  Trachea midline, no masses. Cardiovascular: No clubbing, cyanosis, or edema. Respiratory: Normal respiratory effort, no increased work of breathing. GI: Abdomen is soft, nontender, nondistended, no abdominal masses GU: No CVA tenderness.  Lymph: No cervical or inguinal lymphadenopathy. Skin: No rashes, bruises  or suspicious lesions. Neurologic: Grossly intact, no focal deficits, moving all 4 extremities. Psychiatric: Normal mood and affect.  Laboratory Data: Lab Results  Component Value Date   WBC 5.5 03/25/2021   HGB 14.3 03/25/2021   HCT 42.7 03/25/2021   MCV 90.5 03/25/2021   PLT 187 03/25/2021    Lab Results  Component Value Date   CREATININE 0.96 03/25/2021    No results found for: "PSA"  Lab Results  Component Value Date   TESTOSTERONE 760 02/21/2021    No results found for: "HGBA1C"  Urinalysis    Component Value Date/Time   APPEARANCEUR Clear 03/03/2020 1011   GLUCOSEU Negative 03/03/2020 1011   BILIRUBINUR Negative 03/03/2020 1011   PROTEINUR Negative 03/03/2020 1011   NITRITE Negative 03/03/2020 1011   LEUKOCYTESUR Negative 03/03/2020 1011    Lab Results  Component Value Date   LABMICR Comment 03/03/2020    Pertinent Imaging:  No results found for this or any previous visit.  No results found for this or any previous visit.  No results found for this or any previous visit.  No results found for this or any previous visit.  No results found for this or any previous visit.  No valid procedures specified. No results found for this or any previous visit.  No results found for this or any previous visit.   Assessment &  Plan:    1. Primary hypogonadism in male Followup 1 year with testosterone  2. Erectile dysfunction -sildenafil prn    No follow-ups on file.  Nicolette Bang, MD  [facility] [place]

## 2022-03-08 ENCOUNTER — Ambulatory Visit: Payer: BC Managed Care – PPO | Admitting: Urology

## 2022-03-10 LAB — TESTOSTERONE,FREE AND TOTAL
Testosterone, Free: 10.2 pg/mL (ref 8.7–25.1)
Testosterone: 804 ng/dL (ref 264–916)

## 2022-04-10 ENCOUNTER — Ambulatory Visit (INDEPENDENT_AMBULATORY_CARE_PROVIDER_SITE_OTHER): Payer: BC Managed Care – PPO | Admitting: Family Medicine

## 2022-04-10 ENCOUNTER — Encounter: Payer: Self-pay | Admitting: Family Medicine

## 2022-04-10 VITALS — BP 118/82 | HR 82 | Temp 98.0°F | Ht 73.0 in | Wt 226.0 lb

## 2022-04-10 DIAGNOSIS — Z136 Encounter for screening for cardiovascular disorders: Secondary | ICD-10-CM | POA: Diagnosis not present

## 2022-04-10 DIAGNOSIS — G4710 Hypersomnia, unspecified: Secondary | ICD-10-CM | POA: Diagnosis not present

## 2022-04-10 NOTE — Progress Notes (Signed)
Subjective:    Patient ID: Joshua Dawson, male    DOB: 1980-02-08, 42 y.o.   MRN: [id number]  HPI Patient reports hypersomnolence.  He snores loudly.  His wife frequently sleeps in another bedroom due to the snoring.  He has occasionally actually woke up gasping for air.  He reports hypersomnolence however he attributes a lot of this to working third shift.  He reports falling asleep easily if someone else is driving.  He reports occasionally feeling sleepy after eating lunch.  He reports falling asleep easily watching TV.  He denies falling asleep driving.  He denies falling asleep reading.  He denies falling asleep waiting on a bench.  He attributes a lot of his hypersomnolence to working third shift however his wife is concerned because of the loud snoring and the witnessed apneic episodes Past Medical History:  Diagnosis Date   Allergy    Arthritis   No past surgical history on file.  Current Outpatient Medications on File Prior to Visit  Medication Sig Dispense Refill   escitalopram (LEXAPRO) 10 MG tablet Take 1 tablet by mouth once daily 90 tablet 1   sildenafil (REVATIO) 20 MG tablet Take 1 tablet (20 mg total) by mouth as needed. 30 tablet 11   No current facility-administered medications on file prior to visit.   Allergies  Allergen Reactions   Amoxicillin Hives   Social History   Socioeconomic History   Marital status: Married    Spouse name: Not on file   Number of children: Not on file   Years of education: Not on file   Highest education level: Not on file  Occupational History   Not on file  Tobacco Use   Smoking status: Never   Smokeless tobacco: Current    Types: Chew  Substance and Sexual Activity   Alcohol use: No   Drug use: No   Sexual activity: Yes  Other Topics Concern   Not on file  Social History Narrative   Not on file   Social Determinants of Health   Financial Resource Strain: Not on file  Food Insecurity: Not on file  Transportation  Needs: Not on file  Physical Activity: Not on file  Stress: Not on file  Social Connections: Not on file  Intimate Partner Violence: Not on file      Review of Systems     Objective:   Physical Exam Vitals reviewed.  Constitutional:      General: He is not in acute distress.    Appearance: Normal appearance. He is normal weight. He is not ill-appearing, toxic-appearing or diaphoretic.  HENT:     Head: Normocephalic and atraumatic.  Neck:     Thyroid: No thyromegaly or thyroid tenderness.  Cardiovascular:     Rate and Rhythm: Normal rate and regular rhythm.     Pulses: Normal pulses.     Heart sounds: Normal heart sounds. No murmur heard.    No friction rub. No gallop.  Pulmonary:     Effort: Pulmonary effort is normal. No respiratory distress.     Breath sounds: Normal breath sounds. No wheezing, rhonchi or rales.  Neurological:     General: No focal deficit present.     Mental Status: He is alert and oriented to person, place, and time. Mental status is at baseline.           Assessment & Plan:  Hypersomnolence - Plan: Ambulatory referral to Sleep Studies, CBC with Differential/Platelet, Lipid panel, COMPLETE METABOLIC PANEL  WITH GFR I had scheduled the patient for home sleep study however he never heard from anyone last fall.  Therefore mom to schedule the patient to see his sleep specialist to ensure that this gets followed up on.   Meanwhile check patient's basic lab work including a CBC a CMP and a lipid panel

## 2022-04-11 LAB — CBC WITH DIFFERENTIAL/PLATELET
Absolute Monocytes: 476 cells/uL (ref 200–950)
Basophils Absolute: 43 cells/uL (ref 0–200)
Basophils Relative: 0.7 %
Eosinophils Absolute: 342 cells/uL (ref 15–500)
Eosinophils Relative: 5.6 %
HCT: 47 % (ref 38.5–50.0)
Hemoglobin: 15.6 g/dL (ref 13.2–17.1)
Lymphs Abs: 1556 cells/uL (ref 850–3900)
MCH: 29.8 pg (ref 27.0–33.0)
MCHC: 33.2 g/dL (ref 32.0–36.0)
MCV: 89.9 fL (ref 80.0–100.0)
MPV: 11.2 fL (ref 7.5–12.5)
Monocytes Relative: 7.8 %
Neutro Abs: 3684 cells/uL (ref 1500–7800)
Neutrophils Relative %: 60.4 %
Platelets: 181 10*3/uL (ref 140–400)
RBC: 5.23 10*6/uL (ref 4.20–5.80)
RDW: 12.6 % (ref 11.0–15.0)
Total Lymphocyte: 25.5 %
WBC: 6.1 10*3/uL (ref 3.8–10.8)

## 2022-04-11 LAB — COMPLETE METABOLIC PANEL WITH GFR
AG Ratio: 1.9 (calc) (ref 1.0–2.5)
ALT: 19 U/L (ref 9–46)
AST: 25 U/L (ref 10–40)
Albumin: 4.9 g/dL (ref 3.6–5.1)
Alkaline phosphatase (APISO): 60 U/L (ref 36–130)
BUN: 20 mg/dL (ref 7–25)
CO2: 27 mmol/L (ref 20–32)
Calcium: 9.7 mg/dL (ref 8.6–10.3)
Chloride: 103 mmol/L (ref 98–110)
Creat: 1.02 mg/dL (ref 0.60–1.26)
Globulin: 2.6 g/dL (calc) (ref 1.9–3.7)
Glucose, Bld: 91 mg/dL (ref 65–99)
Potassium: 4.7 mmol/L (ref 3.5–5.3)
Sodium: 138 mmol/L (ref 135–146)
Total Bilirubin: 0.4 mg/dL (ref 0.2–1.2)
Total Protein: 7.5 g/dL (ref 6.1–8.1)
eGFR: 96 mL/min/{1.73_m2} (ref >=60)

## 2022-04-11 LAB — LIPID PANEL
Cholesterol: 166 mg/dL (ref <200)
HDL: 75 mg/dL (ref >=40)
LDL Cholesterol (Calc): 76 mg/dL (calc)
Non-HDL Cholesterol (Calc): 91 mg/dL (calc) (ref <130)
Total CHOL/HDL Ratio: 2.2 (calc) (ref <5.0)
Triglycerides: 72 mg/dL (ref <150)

## 2022-06-13 ENCOUNTER — Institutional Professional Consult (permissible substitution): Payer: BC Managed Care – PPO | Admitting: Pulmonary Disease

## 2022-06-14 ENCOUNTER — Encounter: Payer: Self-pay | Admitting: Pulmonary Disease

## 2022-06-14 ENCOUNTER — Ambulatory Visit (INDEPENDENT_AMBULATORY_CARE_PROVIDER_SITE_OTHER): Payer: BC Managed Care – PPO | Admitting: Pulmonary Disease

## 2022-06-14 VITALS — BP 116/71 | HR 66 | Ht 72.0 in | Wt 220.0 lb

## 2022-06-14 DIAGNOSIS — G4733 Obstructive sleep apnea (adult) (pediatric): Secondary | ICD-10-CM

## 2022-06-14 DIAGNOSIS — G4726 Circadian rhythm sleep disorder, shift work type: Secondary | ICD-10-CM

## 2022-06-14 DIAGNOSIS — R0683 Snoring: Secondary | ICD-10-CM | POA: Diagnosis not present

## 2022-06-14 NOTE — Progress Notes (Signed)
Subjective:    Patient ID: Joshua Dawson, male    DOB: Feb 13, 1980, 42 y.o.   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 Has had episode of gasping for air when lying on back but he now sleeps on his side.    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  He has been working the night shift at [employer] for the last 7 years.  In the daytime he runs an [employer]. His spouse has noted loud snoring and witnessed apneas.  She has also noted that he moves a lot in his sleep.  He reports gasping episodes if he lies on his back so he always sleeps on his side. Epworth sleepiness score is 14 and he reports sleepiness while sitting and reading, passenger in a car or lying down to rest in the afternoon On his workdays, he will sleep around 3 PM.  He often needs melatonin or Benadryl to get to sleep, he will sleep until 11 PM.  On his days off he reverts to a nocturnal sleep schedule with a 10 PM bedtime. Sleep latency can be 1 to 2 hours.  He reports being tired during the day. His weight has increased 10 pounds over the last 2 years There is no history suggestive of cataplexy, sleep paralysis or parasomnias   PMH -asthma as a child Took allergy shots.-Now takes Allegra for seasonal allergies   No past surgical history on file.    Social History   Socioeconomic History   Marital status: Married    Spouse name: Not on file   Number of children: Not on file   Years of education: Not on file   Highest education level: Not on file  Occupational History   Not on file  Tobacco Use   Smoking status: Never   Smokeless tobacco: Current    Types: Chew  Substance and Sexual Activity   Alcohol use: No   Drug use: No   Sexual activity: Yes  Other Topics Concern   Not on file  Social History Narrative   Not on  file   Social Determinants of Health   Financial Resource Strain: Not on file  Food Insecurity: Not on file  Transportation Needs: Not on file  Physical Activity: Not on file  Stress: Not on file  Social Connections: Not on file  Intimate Partner Violence: Not on file   Family History  Problem Relation Age of Onset   Diabetes Father    Heart disease Father    Arthritis Maternal Grandmother    Cancer Maternal Grandmother      Review of Systems Constitutional: negative for anorexia, fevers and sweats  Eyes: negative for irritation, redness and visual disturbance  Ears, nose, mouth, throat, and face: negative for earaches, epistaxis, nasal congestion and sore throat  Respiratory: negative for cough, dyspnea on exertion, sputum and wheezing  Cardiovascular: negative for chest pain, dyspnea, lower extremity edema, orthopnea, palpitations and syncope  Gastrointestinal: negative for abdominal pain, constipation, diarrhea, melena, nausea and vomiting  Genitourinary:negative for dysuria, frequency and hematuria  Hematologic/lymphatic: negative for bleeding, easy bruising and lymphadenopathy  Musculoskeletal:negative for arthralgias, muscle weakness and stiff joints  Neurological: negative for coordination problems, gait problems, headaches and weakness  Endocrine: negative for diabetic symptoms including polydipsia, polyuria and weight loss  Objective:   Physical Exam  Gen. Pleasant,  in no distress, normal affect ENT - no pallor,icterus, no post nasal drip, class 2 airway Neck: No JVD, no thyromegaly, no carotid bruits Lungs: no use of accessory muscles, no dullness to percussion, decreased without rales or rhonchi  Cardiovascular: Rhythm regular, heart sounds  normal, no murmurs or gallops, no peripheral edema Abdomen: soft and non-tender, no hepatosplenomegaly, BS normal. Musculoskeletal: No deformities, no cyanosis or clubbing Neuro:  alert, non focal, no tremors        Assessment & Plan:

## 2022-06-14 NOTE — Patient Instructions (Signed)
X Home sleep test 

## 2022-06-14 NOTE — Assessment & Plan Note (Signed)
Given excessive daytime somnolence, narrow pharyngeal exam, witnessed apneas & loud snoring, obstructive sleep apnea is very likely & an overnight polysomnogram will be scheduled as a home study. The pathophysiology of obstructive sleep apnea , it's cardiovascular consequences & modes of treatment including CPAP were discused with the patient in detail & they evidenced understanding.  His father and brother have OSA.  He is very interested in taking care of his snoring.  He is willing to use CPAP even for mild OSA

## 2022-06-14 NOTE — Assessment & Plan Note (Signed)
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  Okay to use melatonin as a sleep aid but try not to use alcohol. For his leg movements I offered him testing for iron but he prefers to just take an iron supplement OTC

## 2022-06-21 ENCOUNTER — Other Ambulatory Visit: Payer: Self-pay | Admitting: Family Medicine

## 2022-06-22 NOTE — Telephone Encounter (Signed)
Requested Prescriptions  Pending Prescriptions Disp Refills   escitalopram (LEXAPRO) 10 MG tablet [Pharmacy Med Name: Escitalopram Oxalate 10 MG Oral Tablet] 90 tablet 0    Sig: Take 1 tablet by mouth once daily     Psychiatry:  Antidepressants - SSRI Failed - 06/21/2022  5:32 PM      Failed - Valid encounter within last 6 months    Recent Outpatient Visits           1 year ago GAD (generalized anxiety disorder)   [facility] Donita Brooks, MD   1 year ago TIA (transient ischemic attack)   [facility] Donita Brooks, MD   1 year ago Palpitations   [facility] Pickard, Priscille Heidelberg, MD   2 years ago Frostbite, initial encounter   [facility] Donita Brooks, MD   6 years ago Migraine with aura and without status migrainosus, not intractable   [facility] Pickard, Priscille Heidelberg, MD       Future Appointments             In 8 months McKenzie, Mardene Celeste, MD [facility] [place]

## 2022-06-28 ENCOUNTER — Other Ambulatory Visit: Payer: Self-pay | Admitting: Family Medicine

## 2022-06-29 NOTE — Telephone Encounter (Signed)
Requested Prescriptions  Pending Prescriptions Disp Refills   escitalopram (LEXAPRO) 10 MG tablet [Pharmacy Med Name: Escitalopram Oxalate 10 MG Oral Tablet] 90 tablet 0    Sig: Take 1 tablet by mouth once daily     Psychiatry:  Antidepressants - SSRI Failed - 06/28/2022  9:18 PM      Failed - Valid encounter within last 6 months    Recent Outpatient Visits           1 year ago GAD (generalized anxiety disorder)   [facility] Pickard, Priscille Heidelberg, MD   1 year ago TIA (transient ischemic attack)   [facility] Donita Brooks, MD   1 year ago Palpitations   [facility] Pickard, Priscille Heidelberg, MD   2 years ago Frostbite, initial encounter   [facility] Donita Brooks, MD   6 years ago Migraine with aura and without status migrainosus, not intractable   [facility] Pickard, Priscille Heidelberg, MD       Future Appointments             In 8 months McKenzie, Mardene Celeste, MD [facility] [place]

## 2022-07-10 DIAGNOSIS — G4730 Sleep apnea, unspecified: Secondary | ICD-10-CM | POA: Diagnosis not present

## 2022-07-12 DIAGNOSIS — G4730 Sleep apnea, unspecified: Secondary | ICD-10-CM | POA: Diagnosis not present

## 2022-08-07 ENCOUNTER — Telehealth: Payer: Self-pay | Admitting: Pulmonary Disease

## 2022-08-07 ENCOUNTER — Ambulatory Visit (INDEPENDENT_AMBULATORY_CARE_PROVIDER_SITE_OTHER): Payer: BC Managed Care – PPO

## 2022-08-07 DIAGNOSIS — R0683 Snoring: Secondary | ICD-10-CM

## 2022-08-07 NOTE — Telephone Encounter (Signed)
Suboptimal home sleep test with only 2 hours of study obtained. This showed mild sleep apnea with AHI 9/hour  Ideally we should repeat home sleep test

## 2022-08-09 NOTE — Telephone Encounter (Signed)
I called the pt and there was no answer- LMTCB. ?

## 2022-08-11 NOTE — Telephone Encounter (Signed)
Sending message to Joshua Dawson to have pt complete another study.  Has the patient been made aware that he will need to repeat the study?

## 2022-08-11 NOTE — Telephone Encounter (Signed)
I called the pt and again, there was no answer- LMTCB again   When he calls back he needs to be informed a new study has to be ordered since the first one only recorded 2 hours.

## 2022-08-11 NOTE — Telephone Encounter (Signed)
PT ret Leslies call to poss resched sleep test. Pls call @ [phone]

## 2022-08-18 NOTE — Telephone Encounter (Signed)
Atc pt no answer lvmm for pt to give the office a call back.

## 2022-08-23 NOTE — Telephone Encounter (Signed)
[name] with [facility] confirmed they will have pt complete another study.  Please make pt aware they will need to repeat the HST with [facility].  Thanks!

## 2022-08-23 NOTE — Telephone Encounter (Signed)
Called patient but he did not answer. Since this was the fourth call and he has not been active on MyChart since January 2024, will mail letter to patient and close encounter.

## 2022-09-18 NOTE — Telephone Encounter (Signed)
Oretha Milch, MD  Lanna Poche D Just leave a note in the chart so there is a paper trail please       Previous Messages    ----- Message ----- From: Antionette Fairy Sent: 08/31/2022   2:25 PM EDT To: Oretha Milch, MD Subject: HST                                            Hi, Dr. Virgel Manifold.  Victorino Dike with [facility] let me know they have tried to reach the patient several times to call & schedule another HST since the study previously completed was suboptimal.  [facility] has not been abel to reach the patient, having left the patient several messages.  I also just called the patient & had to leave a voicemail as well.  Thanks, [employer]

## 2022-10-05 ENCOUNTER — Other Ambulatory Visit: Payer: Self-pay | Admitting: Family Medicine

## 2022-10-06 NOTE — Telephone Encounter (Signed)
Requested Prescriptions  Pending Prescriptions Disp Refills   escitalopram (LEXAPRO) 10 MG tablet [Pharmacy Med Name: Escitalopram Oxalate 10 MG Oral Tablet] 90 tablet 0    Sig: Take 1 tablet by mouth once daily     Psychiatry:  Antidepressants - SSRI Failed - 10/05/2022  8:09 AM      Failed - Valid encounter within last 6 months    Recent Outpatient Visits           1 year ago GAD (generalized anxiety disorder)   [facility] Pickard, Priscille Heidelberg, MD   1 year ago TIA (transient ischemic attack)   [facility] Donita Brooks, MD   1 year ago Palpitations   [facility] Pickard, Priscille Heidelberg, MD   2 years ago Frostbite, initial encounter   [facility] Donita Brooks, MD   6 years ago Migraine with aura and without status migrainosus, not intractable   [facility] Pickard, Priscille Heidelberg, MD       Future Appointments             In 5 months McKenzie, Mardene Celeste, MD [facility] [place]

## 2023-01-10 ENCOUNTER — Other Ambulatory Visit: Payer: Self-pay | Admitting: Family Medicine

## 2023-03-05 ENCOUNTER — Other Ambulatory Visit: Payer: BC Managed Care – PPO

## 2023-03-05 DIAGNOSIS — E291 Testicular hypofunction: Secondary | ICD-10-CM | POA: Diagnosis not present

## 2023-03-08 LAB — CBC WITH DIFFERENTIAL/PLATELET
Basophils Absolute: 0 10*3/uL (ref 0.0–0.2)
Basos: 1 %
EOS (ABSOLUTE): 0.6 10*3/uL — ABNORMAL HIGH (ref 0.0–0.4)
Eos: 7 %
Hematocrit: 45.8 % (ref 37.5–51.0)
Hemoglobin: 15.2 g/dL (ref 13.0–17.7)
Immature Grans (Abs): 0 10*3/uL (ref 0.0–0.1)
Immature Granulocytes: 0 %
Lymphocytes Absolute: 1.8 10*3/uL (ref 0.7–3.1)
Lymphs: 24 %
MCH: 31 pg (ref 26.6–33.0)
MCHC: 33.2 g/dL (ref 31.5–35.7)
MCV: 94 fL (ref 79–97)
Monocytes Absolute: 0.6 10*3/uL (ref 0.1–0.9)
Monocytes: 7 %
Neutrophils Absolute: 4.5 10*3/uL (ref 1.4–7.0)
Neutrophils: 61 %
Platelets: 177 10*3/uL (ref 150–450)
RBC: 4.9 x10E6/uL (ref 4.14–5.80)
RDW: 12.6 % (ref 11.6–15.4)
WBC: 7.5 10*3/uL (ref 3.4–10.8)

## 2023-03-08 LAB — TESTOSTERONE,FREE AND TOTAL
Testosterone, Free: 13.7 pg/mL (ref 6.8–21.5)
Testosterone: 684 ng/dL (ref 264–916)

## 2023-03-12 ENCOUNTER — Ambulatory Visit: Payer: BC Managed Care – PPO | Admitting: Urology

## 2023-03-12 VITALS — BP 123/70 | HR 71

## 2023-03-12 DIAGNOSIS — N528 Other male erectile dysfunction: Secondary | ICD-10-CM | POA: Diagnosis not present

## 2023-03-12 MED ORDER — SILDENAFIL CITRATE 20 MG PO TABS: 20.0000 mg | ORAL_TABLET | ORAL | 11 refills | Status: DC | PRN

## 2023-03-12 NOTE — Progress Notes (Signed)
 03/12/2023 10:18 AM   Joshua Dawson 03/29/1980 [id number]  Referring provider: Donita Brooks, MD [address] [place],  [place] [address]  Erectile dysfunction   HPI: Joshua Dawson is a 43yo here for followup for erectile dysfunction. Testosterone 684. With 13% free testosterone. He uses sildenafil 20-40 mg prn with good results. No other complaints today   PMH: Past Medical History:  Diagnosis Date   Allergy    Arthritis     Surgical History: No past surgical history on file.  Home Medications:  Allergies as of 03/12/2023       Reactions   Amoxicillin Hives        Medication List        Accurate as of March 12, 2023 10:18 AM. If you have any questions, ask your nurse or doctor.          escitalopram 10 MG tablet Commonly known as: LEXAPRO Take 1 tablet by mouth once daily   sildenafil 20 MG tablet Commonly known as: REVATIO Take 1 tablet (20 mg total) by mouth as needed.        Allergies:  Allergies  Allergen Reactions   Amoxicillin Hives    Family History: Family History  Problem Relation Age of Onset   Diabetes Father    Heart disease Father    Arthritis Maternal Grandmother    Cancer Maternal Grandmother     Social History:  reports that he has never smoked. His smokeless tobacco use includes chew. He reports that he does not drink alcohol and does not use drugs.  ROS: All other review of systems were reviewed and are negative except what is noted above in HPI  Physical Exam: BP 123/70   Pulse 71   Constitutional:  Alert and oriented, No acute distress. HEENT: [place] AT, moist mucus membranes.  Trachea midline, no masses. Cardiovascular: No clubbing, cyanosis, or edema. Respiratory: Normal respiratory effort, no increased work of breathing. GI: Abdomen is soft, nontender, nondistended, no abdominal masses GU: No CVA tenderness.  Lymph: No cervical or inguinal lymphadenopathy. Skin: No rashes, bruises or suspicious  lesions. Neurologic: Grossly intact, no focal deficits, moving all 4 extremities. Psychiatric: Normal mood and affect.  Laboratory Data: Lab Results  Component Value Date   WBC 7.5 03/05/2023   HGB 15.2 03/05/2023   HCT 45.8 03/05/2023   MCV 94 03/05/2023   PLT 177 03/05/2023    Lab Results  Component Value Date   CREATININE 1.02 04/10/2022    No results found for: "PSA"  Lab Results  Component Value Date   TESTOSTERONE 684 03/05/2023    No results found for: "HGBA1C"  Urinalysis    Component Value Date/Time   APPEARANCEUR Clear 03/03/2020 1011   GLUCOSEU Negative 03/03/2020 1011   BILIRUBINUR Negative 03/03/2020 1011   PROTEINUR Negative 03/03/2020 1011   NITRITE Negative 03/03/2020 1011   LEUKOCYTESUR Negative 03/03/2020 1011    Lab Results  Component Value Date   LABMICR Comment 03/03/2020    Pertinent Imaging:  No results found for this or any previous visit.  No results found for this or any previous visit.  No results found for this or any previous visit.  No results found for this or any previous visit.  No results found for this or any previous visit.  No results found for this or any previous visit.  No results found for this or any previous visit.  No results found for this or any previous visit.  Assessment & Plan:    1. Other male erectile dysfunction (Primary) -Continue sildenafil 20-40mg  prn   No follow-ups on file.  Wilkie Aye, MD  [facility] [place]

## 2023-03-16 ENCOUNTER — Telehealth: Payer: Self-pay

## 2023-03-16 NOTE — Telephone Encounter (Signed)
Medication prior authorization request received.  Completed PA request through cover my meds for drug Sildenafil. KEY: [id number]  Approved: Pending

## 2023-03-20 ENCOUNTER — Encounter: Payer: Self-pay | Admitting: Urology

## 2023-03-20 NOTE — Patient Instructions (Signed)

## 2023-04-26 ENCOUNTER — Other Ambulatory Visit: Payer: Self-pay | Admitting: Family Medicine

## 2023-04-27 NOTE — Telephone Encounter (Signed)
 Requested medications are due for refill today.  yes  Requested medications are on the active medications list.  yes  Last refill. 01/11/2023 #90 0 rf  Future visit scheduled.   yes  Notes to clinic.  Pt last seen 03/31/2022    Requested Prescriptions  Pending Prescriptions Disp Refills   escitalopram (LEXAPRO) 10 MG tablet [Pharmacy Med Name: Escitalopram Oxalate 10 MG Oral Tablet] 90 tablet 0    Sig: Take 1 tablet by mouth once daily     Psychiatry:  Antidepressants - SSRI Failed - 04/27/2023  5:29 PM      Failed - Valid encounter within last 6 months    Recent Outpatient Visits           1 year ago Hypersomnolence   [facility] Donita Brooks, MD   1 year ago Hypersomnolence   [place] [facility] Pickard, Priscille Heidelberg, MD       Future Appointments             In 10 months McKenzie, Mardene Celeste, MD [facility] [employer]

## 2023-04-30 ENCOUNTER — Telehealth: Payer: Self-pay | Admitting: Family Medicine

## 2023-04-30 NOTE — Telephone Encounter (Signed)
 Patient called, mailbox is full. If he returns the call, lexapro was refused due to he needs an appointment.   Copied from CRM [id number]. Topic: Clinical - Medication Question >> Apr 30, 2023 10:45 AM Macon Large wrote: Reason for CRM: Patient wants to know why the Rx for escitalopram (LEXAPRO) 10 MG tablet was not approved. Patient request call back to discuss. Call back# [phone]

## 2023-05-01 ENCOUNTER — Telehealth: Payer: Self-pay

## 2023-05-01 ENCOUNTER — Ambulatory Visit: Admitting: Family Medicine

## 2023-05-01 ENCOUNTER — Other Ambulatory Visit: Payer: Self-pay

## 2023-05-01 DIAGNOSIS — G4726 Circadian rhythm sleep disorder, shift work type: Secondary | ICD-10-CM

## 2023-05-01 DIAGNOSIS — G4733 Obstructive sleep apnea (adult) (pediatric): Secondary | ICD-10-CM

## 2023-05-01 DIAGNOSIS — E291 Testicular hypofunction: Secondary | ICD-10-CM

## 2023-05-01 DIAGNOSIS — N528 Other male erectile dysfunction: Secondary | ICD-10-CM

## 2023-05-01 MED ORDER — ESCITALOPRAM OXALATE 10 MG PO TABS: 10.0000 mg | ORAL_TABLET | Freq: Every day | ORAL | 0 refills | Status: DC

## 2023-05-01 NOTE — Telephone Encounter (Signed)
 Copied from CRM [id number]. Topic: Clinical - Prescription Issue >> May 01, 2023  4:10 PM Pierre Bali B wrote: Reason for CRM: pt wife called in and stated that the patient is in need of his escitalopram (LEXAPRO) 10 MG tablet . When they went to go pick it up at the poharmacy she said they told her it was denied but didn't give her a reason why it was denied . She is asking if there is any way someone can give her a call to see if she can get a week supply since he has only 1 pill left and he is suppose to take 2 a day

## 2023-05-10 ENCOUNTER — Ambulatory Visit: Admitting: Family Medicine

## 2023-05-10 ENCOUNTER — Encounter: Payer: Self-pay | Admitting: Family Medicine

## 2023-05-10 VITALS — BP 122/86 | HR 63 | Temp 97.7°F | Ht 72.0 in | Wt 238.2 lb

## 2023-05-10 DIAGNOSIS — G4726 Circadian rhythm sleep disorder, shift work type: Secondary | ICD-10-CM | POA: Diagnosis not present

## 2023-05-10 DIAGNOSIS — G4710 Hypersomnia, unspecified: Secondary | ICD-10-CM

## 2023-05-10 MED ORDER — ESCITALOPRAM OXALATE 10 MG PO TABS: 10.0000 mg | ORAL_TABLET | Freq: Every day | ORAL | 5 refills | Status: DC

## 2023-05-10 NOTE — Progress Notes (Signed)
 Subjective:    Patient ID: Joshua Dawson, male    DOB: 1980/10/29, 43 y.o.   MRN: [id number]  HPI 3/24 Patient reports hypersomnolence.  He snores loudly.  His wife frequently sleeps in another bedroom due to the snoring.  He has occasionally actually woke up gasping for air.  He reports hypersomnolence however he attributes a lot of this to working third shift.  He reports falling asleep easily if someone else is driving.  He reports occasionally feeling sleepy after eating lunch.  He reports falling asleep easily watching TV.  He denies falling asleep driving.  He denies falling asleep reading.  He denies falling asleep waiting on a bench.  He attributes a lot of his hypersomnolence to working third shift however his wife is concerned because of the loud snoring and the witnessed apneic episodes.  At that time, my plan was: I had scheduled the patient for home sleep study however he never heard from anyone last fall.  Therefore mom to schedule the patient to see his sleep specialist to ensure that this gets followed up on.   Meanwhile check patient's basic lab work including a CBC a CMP and a lipid panel  05/10/23 Patient was referred to a pulmonologist who arranged a home sleep study.  However the home sleep study was inconclusive due to lack of recording time.  Patient could not wear the device at home and stated that he continued to put it on and take it off throughout the night.  However he continues to report heavy snoring.  His wife has to sleep in another room.  Today he denies hypersomnolence.  He states that he is not falling asleep driving a car.  He is not falling asleep after lunch.  He is not falling asleep watching TV or while reading.  However he would like a referral for a sleep study and a lab as his wife has witnessed apneic episodes Past Medical History:  Diagnosis Date   Allergy    Arthritis   No past surgical history on file.  Current Outpatient Medications on File Prior  to Visit  Medication Sig Dispense Refill   escitalopram (LEXAPRO) 10 MG tablet Take 1 tablet (10 mg total) by mouth daily. 30 tablet 0   sildenafil (REVATIO) 20 MG tablet Take 1 tablet (20 mg total) by mouth as needed. 30 tablet 11   No current facility-administered medications on file prior to visit.   Allergies  Allergen Reactions   Amoxicillin Hives   Social History   Socioeconomic History   Marital status: Married    Spouse name: Not on file   Number of children: Not on file   Years of education: Not on file   Highest education level: Not on file  Occupational History   Not on file  Tobacco Use   Smoking status: Never   Smokeless tobacco: Current    Types: Chew  Substance and Sexual Activity   Alcohol use: No   Drug use: No   Sexual activity: Yes  Other Topics Concern   Not on file  Social History Narrative   Not on file   Social Drivers of Health   Financial Resource Strain: Not on file  Food Insecurity: Not on file  Transportation Needs: Not on file  Physical Activity: Not on file  Stress: Not on file  Social Connections: Not on file  Intimate Partner Violence: Not on file      Review of Systems  Objective:   Physical Exam Vitals reviewed.  Constitutional:      General: He is not in acute distress.    Appearance: Normal appearance. He is normal weight. He is not ill-appearing, toxic-appearing or diaphoretic.  HENT:     Head: Normocephalic and atraumatic.  Neck:     Thyroid: No thyromegaly or thyroid tenderness.  Cardiovascular:     Rate and Rhythm: Normal rate and regular rhythm.     Pulses: Normal pulses.     Heart sounds: Normal heart sounds. No murmur heard.    No friction rub. No gallop.  Pulmonary:     Effort: Pulmonary effort is normal. No respiratory distress.     Breath sounds: Normal breath sounds. No wheezing, rhonchi or rales.  Neurological:     General: No focal deficit present.     Mental Status: He is alert and oriented to  person, place, and time. Mental status is at baseline.           Assessment & Plan:  Hypersomnolence - Plan: Ambulatory referral to Sleep Studies  Circadian rhythm sleep disorder, shift work type - Plan: escitalopram (LEXAPRO) 10 MG tablet I will refer the patient for a formal sleep study to evaluate the presence of obstructive sleep apnea.  Did refill his Lexapro that he takes for anxiety and depression.

## 2023-05-30 ENCOUNTER — Other Ambulatory Visit: Payer: Self-pay | Admitting: Family Medicine

## 2023-05-30 DIAGNOSIS — G4726 Circadian rhythm sleep disorder, shift work type: Secondary | ICD-10-CM

## 2023-05-31 NOTE — Telephone Encounter (Signed)
 Requesting refill. Approve if appropriate. Thank you

## 2023-09-27 IMAGING — MR MR HEAD WO/W CM
14 series · 48 of 48 positions shown · IV contrast (multihance)
Comparison: Carotid Doppler ultrasound 03/31/2021.

CLINICAL DATA: 40-year-old male with dizziness and left arm
numbness. Symptoms for 2 months. TIA. No known injury.

EXAM:
MRI HEAD WITHOUT AND WITH CONTRAST
TECHNIQUE: Multiplanar, multiecho pulse sequences of the brain and surrounding
structures were obtained without and with intravenous contrast.
CONTRAST:  18mL MULTIHANCE GADOBENATE DIMEGLUMINE 529 MG/ML IV SOLN

[Series 5: T1 · sagittal · 4.0mm · 0.75mm/px · 1 of 31 slices shown (1 of 3)]
[im 1/31]
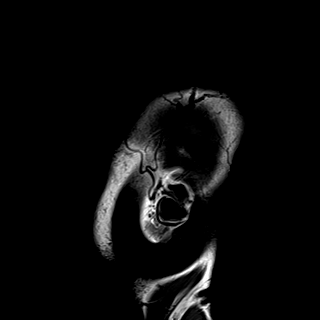

[Series 6: DWI · axial · 3.0mm · 0.94mm/px · z∈[-69,+85]mm · 8 of 176 slices shown (1 of 3)]
[im 1/176]
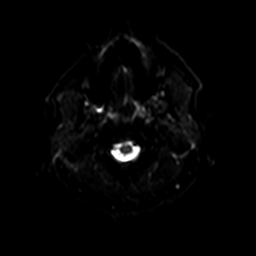
[im 26/176]
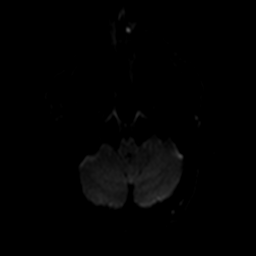
[im 51/176]
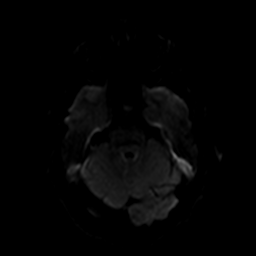
[im 76/176]
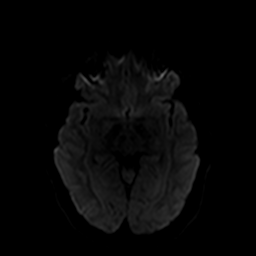
[im 101/176]
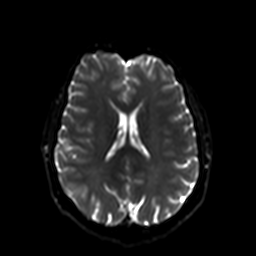
[im 126/176]
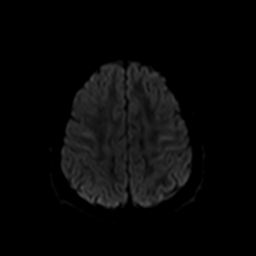
[im 151/176]
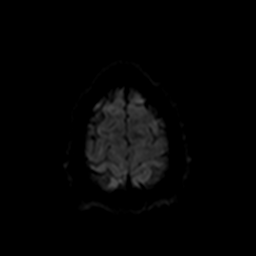
[im 176/176]
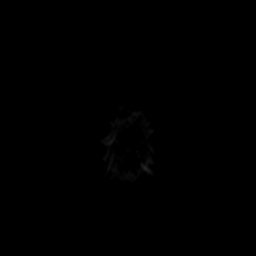

[Series 7: ax dwi_tracew · axial · 3.0mm · 0.94mm/px · z∈[-69,+85]mm · 4 of 88 slices shown]
[im 1/88]
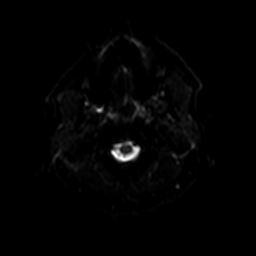
[im 30/88]
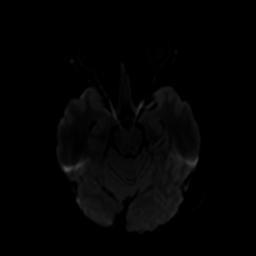
[im 59/88]
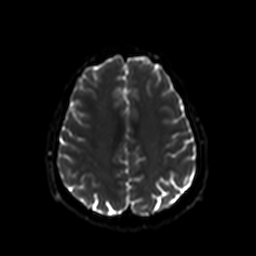
[im 88/88]
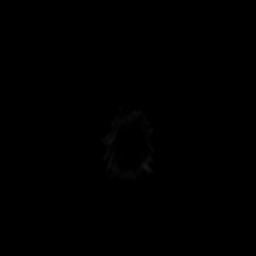

[Series 8: ax dwi_adc · axial · 3.0mm · 0.94mm/px · z∈[-69,+85]mm · 2 of 43 slices shown]
[im 1/43]
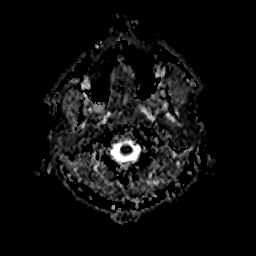
[im 43/43]
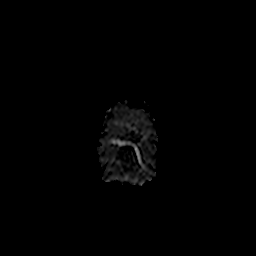

[Series 9: DWI · coronal · 5.0mm · 1.44mm/px · 3 of 64 slices shown (2 of 3)]
[im 1/64]
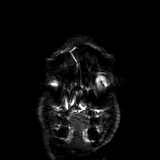
[im 32/64]
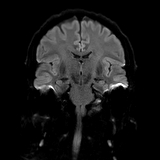
[im 64/64]
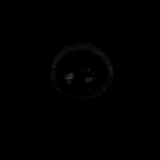

[Series 10: DWI · coronal · 5.0mm · 1.44mm/px · 2 of 32 slices shown (3 of 3)]
[im 1/32]
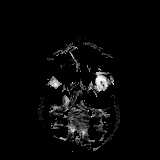
[im 32/32]
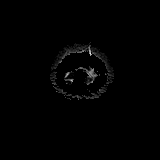

[Series 11: T2 · axial · 4.0mm · 0.36mm/px · 1 of 30 slices shown]
[im 1/30]
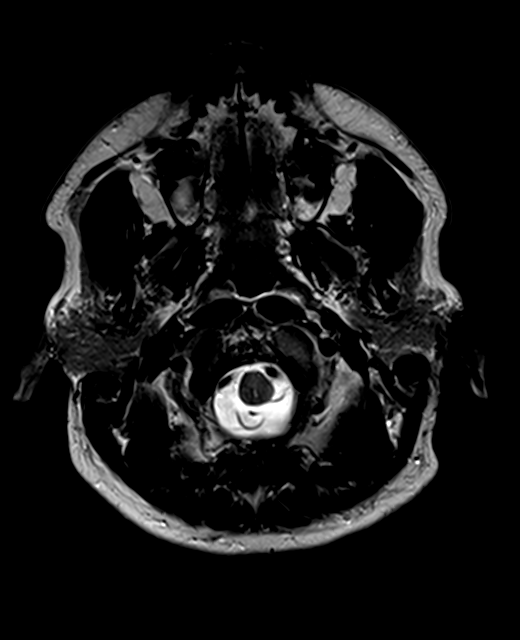

[Series 12: FLAIR · axial · 3.0mm · 0.72mm/px · 1 of 26 slices shown]
[im 1/26]
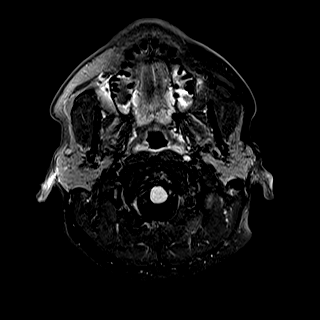

[Series 13: swi_images · axial · 1.5mm · 0.90mm/px · z∈[-61,+81]mm · 5 of 96 slices shown]
[im 1/96]
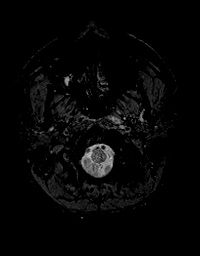
[im 24/96]
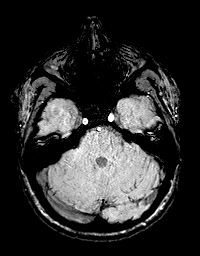
[im 48/96]
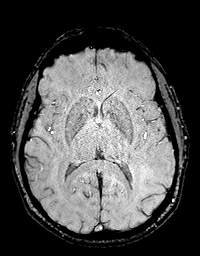
[im 72/96]
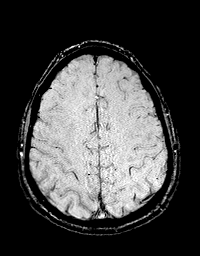
[im 96/96]
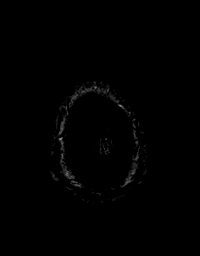

[Series 15: T1 · axial · 1.0mm · 0.94mm/px · z∈[-93,+64]mm · 8 of 160 slices shown (2 of 3)]
[im 1/160]
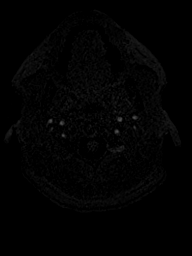
[im 23/160]
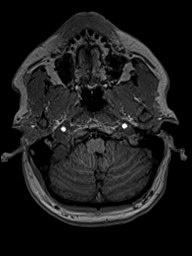
[im 46/160]
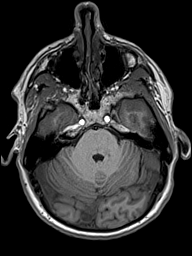
[im 69/160]
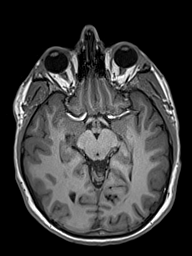
[im 91/160]
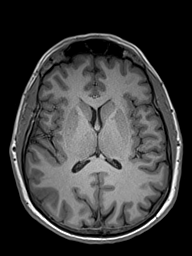
[im 114/160]
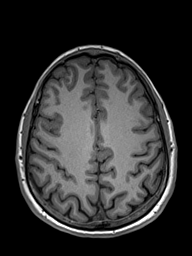
[im 137/160]
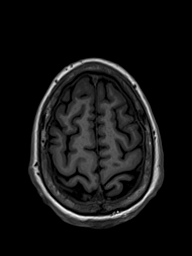
[im 160/160]
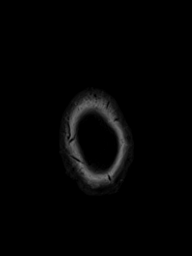

[Series 16: T2 post-contrast · coronal · 4.0mm · 0.36mm/px · 2 of 35 slices shown]
[im 1/35]
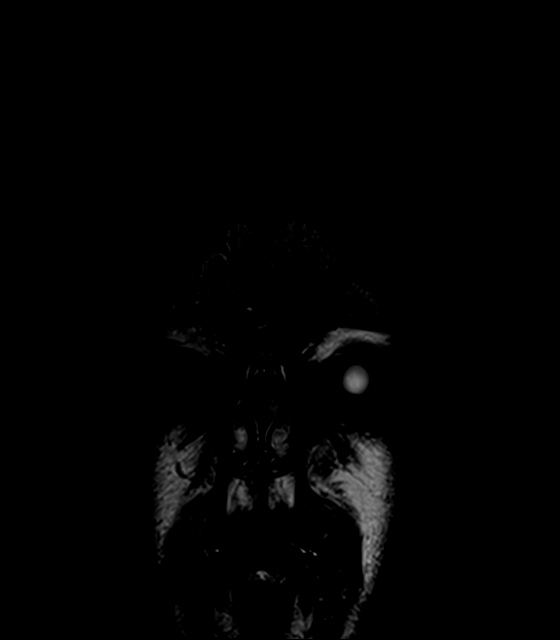
[im 35/35]
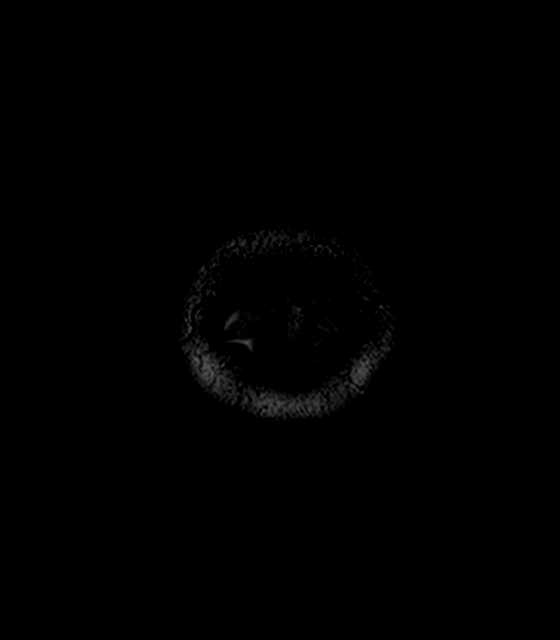

[Series 17: T1 · axial · 1.0mm · 0.94mm/px · z∈[-93,+64]mm · 8 of 160 slices shown (3 of 3)]
[im 1/160]
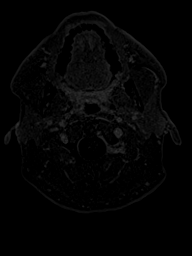
[im 23/160]
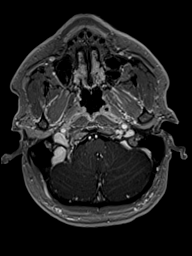
[im 46/160]
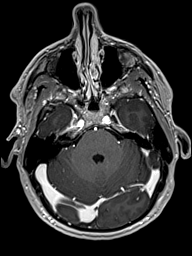
[im 69/160]
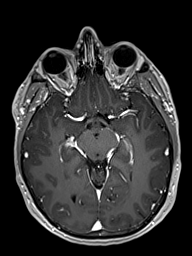
[im 91/160]
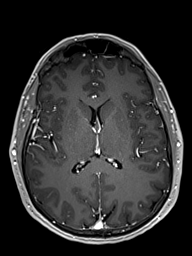
[im 114/160]
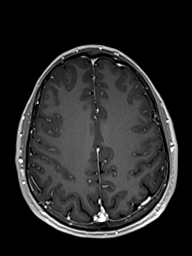
[im 137/160]
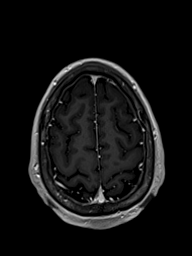
[im 160/160]
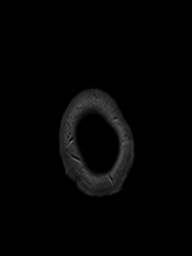

[Series 18: T1 post-contrast · coronal · 4.0mm · 0.72mm/px · 2 of 35 slices shown (1 of 2)]
[im 1/35]
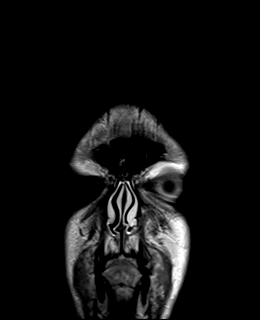
[im 35/35]
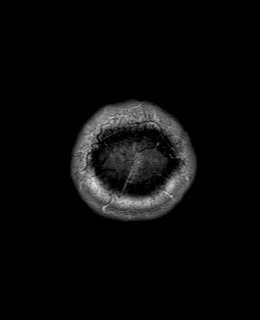

[Series 19: T1 post-contrast · sagittal · 4.0mm · 0.75mm/px · 1 of 31 slices shown (2 of 2)]
[im 1/31]
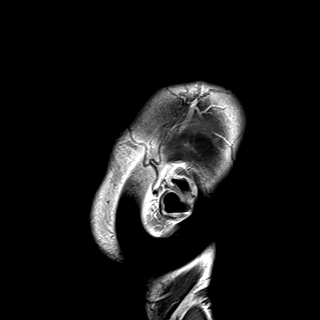

[48 of 48 positions shown; findings below may reference images not displayed]

FINDINGS: Brain:

Normal cerebral volume. No restricted diffusion to suggest acute
infarction. No midline shift, mass effect, evidence of mass lesion,
ventriculomegaly, extra-axial collection or acute intracranial
hemorrhage. Cervicomedullary junction and pituitary are within
normal limits.

Questionable subtle linear chronic lacunar infarct in the right
cerebellum on series 15, image 34. Otherwise normal Gray and white
matter signal throughout the brain. No other encephalomalacia, and
no chronic cerebral blood products identified. No abnormal
enhancement identified. Small anterior inferior right frontal gyrus
developmental venous anomaly (normal variant series 18, image 9). No
dural thickening.

Vascular: Major intracranial vascular flow voids are preserved. The
major dural venous sinuses are enhancing and appear to be normal.

Skull and upper cervical spine: Negative.

Sinuses/Orbits: Normal orbits. Mild bilateral maxillary sinus
mucosal thickening and/or small right alveolar recess mucous
retention cyst. Other paranasal sinuses are clear. No sinus fluid
levels.

Other: Mastoid air cells are clear. Visible internal auditory
structures appear normal. Negative visible scalp and face.
IMPRESSION: Questionable tiny chronic lacunar infarct in the right cerebellum,
but an otherwise normal MRI appearance of the brain. No acute
intracranial abnormality.

## 2024-02-29 ENCOUNTER — Ambulatory Visit: Admitting: Family Medicine

## 2024-02-29 ENCOUNTER — Encounter: Payer: Self-pay | Admitting: Family Medicine

## 2024-02-29 VITALS — BP 124/72 | HR 81 | Ht 72.0 in | Wt 231.8 lb

## 2024-02-29 DIAGNOSIS — N528 Other male erectile dysfunction: Secondary | ICD-10-CM

## 2024-02-29 DIAGNOSIS — F411 Generalized anxiety disorder: Secondary | ICD-10-CM | POA: Diagnosis not present

## 2024-02-29 DIAGNOSIS — G4710 Hypersomnia, unspecified: Secondary | ICD-10-CM | POA: Diagnosis not present

## 2024-02-29 MED ORDER — SILDENAFIL CITRATE 20 MG PO TABS: 20.0000 mg | ORAL_TABLET | ORAL | 11 refills | Status: AC | PRN

## 2024-02-29 MED ORDER — ESCITALOPRAM OXALATE 10 MG PO TABS: 10.0000 mg | ORAL_TABLET | Freq: Every day | ORAL | 3 refills | Status: AC

## 2024-02-29 NOTE — Progress Notes (Signed)
 "  Subjective:    Patient ID: Joshua Dawson, male    DOB: 05-Aug-1980, 44 y.o.   MRN: [id number]  HPI  Patient is a 44 year old Caucasian gentleman that was taking Lexapro  10 mg a day for generalized anxiety disorder.  He had done quite well for a while.  However his insurance lapsed due to a change in employment.  As a result he gradually weaned himself off the medication over a period of 2 to 3 weeks.  Shortly after stopping the medication he noticed withdrawal symptoms.  Especially problematic where the brain zaps.  Patient describes them as a surging electrical shock like sensation in his head that would last a split-second and then subside.  This went on for approximately 3 months.  After starting back on the medication approximately 4 weeks ago they have subsided over 80%.  He states that the medication seems to be helping with anxiety and he would like to stay on the medication.  We discussed potentially weaning off the medicine slower to prevent withdrawal symptoms however he prefers to stay on the Lexapro .    Please see my previous office visits, the patient is again requesting a referral to a sleep study.  He has had a home sleep study but this was inconclusive due to him taking the mask on and off.  I then referred him for an in-lab sleep study however this never occurred due to his insurance lapsing.  He continues to report hypersomnolence.  His wife has stated that she has heard him stop breathing at night.  He states that his wife calls him a violent sleeper.  He tosses and turns.  He does not rest well.  He does not feel rested in the morning. Past Medical History:  Diagnosis Date   Allergy    Arthritis   No past surgical history on file.  Current Outpatient Medications on File Prior to Visit  Medication Sig Dispense Refill   escitalopram  (LEXAPRO ) 10 MG tablet TAKE 1 TABLET(10 MG) BY MOUTH DAILY 30 tablet 5   sildenafil  (REVATIO ) 20 MG tablet Take 1 tablet (20 mg total) by  mouth as needed. 30 tablet 11   No current facility-administered medications on file prior to visit.   Allergies  Allergen Reactions   Amoxicillin Hives   Social History   Socioeconomic History   Marital status: Married    Spouse name: Not on file   Number of children: Not on file   Years of education: Not on file   Highest education level: Not on file  Occupational History   Not on file  Tobacco Use   Smoking status: Never   Smokeless tobacco: Current    Types: Chew  Substance and Sexual Activity   Alcohol use: No   Drug use: No   Sexual activity: Yes  Other Topics Concern   Not on file  Social History Narrative   Not on file   Social Drivers of Health   Tobacco Use: High Risk (05/10/2023)   Patient History    Smoking Tobacco Use: Never    Smokeless Tobacco Use: Current    Passive Exposure: Not on file  Financial Resource Strain: Not on file  Food Insecurity: Not on file  Transportation Needs: Not on file  Physical Activity: Not on file  Stress: Not on file  Social Connections: Not on file  Intimate Partner Violence: Not on file  Depression (PHQ2-9): Low Risk (05/10/2023)   Depression (PHQ2-9)    PHQ-2 Score: 0  Alcohol Screen: Not on file  Housing: Not on file  Utilities: Not on file  Health Literacy: Not on file      Review of Systems     Objective:   Physical Exam Vitals reviewed.  Constitutional:      General: He is not in acute distress.    Appearance: Normal appearance. He is normal weight. He is not ill-appearing, toxic-appearing or diaphoretic.  HENT:     Head: Normocephalic and atraumatic.  Neck:     Thyroid: No thyromegaly or thyroid tenderness.  Cardiovascular:     Rate and Rhythm: Normal rate and regular rhythm.     Pulses: Normal pulses.     Heart sounds: Normal heart sounds. No murmur heard.    No friction rub. No gallop.  Pulmonary:     Effort: Pulmonary effort is normal. No respiratory distress.     Breath sounds: Normal  breath sounds. No wheezing, rhonchi or rales.  Neurological:     General: No focal deficit present.     Mental Status: He is alert and oriented to person, place, and time. Mental status is at baseline.           Hypersomnolence - Plan: Home sleep test  GAD (generalized anxiety disorder) - Plan: escitalopram  (LEXAPRO ) 10 MG tablet  Other male erectile dysfunction - Plan: sildenafil  (REVATIO ) 20 MG tablet Patient has generalized anxiety disorder.  I recommended continuing Lexapro  10 mg daily.  In 2 to 3 weeks we can make an assessment of whether he wants to increase the dose.  I feel that with additional time, the remainder of his withdrawal symptoms in the past.  He states that they are approximately 85% better after just 3 weeks.  He requests another sleep study primarily to appease his wife.  He states that she has noticed apneic episodes.  He does complain of hypersomnolence.  I suggested that we do the sleep study and a lab however he would like to try a home sleep study first.  He does not believe that he would be able to sleep in the lab without taking some type of sleeping aid "

## 2024-03-13 ENCOUNTER — Other Ambulatory Visit: Payer: BC Managed Care – PPO

## 2024-03-19 ENCOUNTER — Ambulatory Visit: Payer: BC Managed Care – PPO | Admitting: Urology

## 2024-05-01 ENCOUNTER — Encounter: Admitting: Family Medicine
